# Patient Record
Sex: Female | Born: 1990 | Race: Black or African American | Hispanic: No | Marital: Married | Smoking: Never smoker
Health system: Southern US, Community
[De-identification: ages and names within clinical notes are randomized; demographics above are authoritative.]

## PROBLEM LIST (undated history)

## (undated) ENCOUNTER — Inpatient Hospital Stay (HOSPITAL_COMMUNITY): Payer: Self-pay

## (undated) ENCOUNTER — Ambulatory Visit: Admission: EM | Payer: Medicaid Other | Source: Home / Self Care

## (undated) DIAGNOSIS — E559 Vitamin D deficiency, unspecified: Secondary | ICD-10-CM

## (undated) DIAGNOSIS — J4540 Moderate persistent asthma, uncomplicated: Secondary | ICD-10-CM

## (undated) DIAGNOSIS — R87629 Unspecified abnormal cytological findings in specimens from vagina: Secondary | ICD-10-CM

## (undated) DIAGNOSIS — K8010 Calculus of gallbladder with chronic cholecystitis without obstruction: Secondary | ICD-10-CM

## (undated) DIAGNOSIS — R51 Headache: Secondary | ICD-10-CM

## (undated) DIAGNOSIS — R0600 Dyspnea, unspecified: Secondary | ICD-10-CM

## (undated) DIAGNOSIS — A6920 Lyme disease, unspecified: Secondary | ICD-10-CM

## (undated) DIAGNOSIS — I10 Essential (primary) hypertension: Secondary | ICD-10-CM

## (undated) DIAGNOSIS — D649 Anemia, unspecified: Secondary | ICD-10-CM

## (undated) DIAGNOSIS — M797 Fibromyalgia: Secondary | ICD-10-CM

## (undated) DIAGNOSIS — I011 Acute rheumatic endocarditis: Secondary | ICD-10-CM

## (undated) DIAGNOSIS — D509 Iron deficiency anemia, unspecified: Secondary | ICD-10-CM

## (undated) DIAGNOSIS — J45909 Unspecified asthma, uncomplicated: Secondary | ICD-10-CM

## (undated) DIAGNOSIS — F99 Mental disorder, not otherwise specified: Secondary | ICD-10-CM

## (undated) DIAGNOSIS — R519 Headache, unspecified: Secondary | ICD-10-CM

## (undated) HISTORY — DX: Mental disorder, not otherwise specified: F99

## (undated) HISTORY — PX: INDUCED ABORTION: SHX677

## (undated) HISTORY — DX: Anemia, unspecified: D64.9

## (undated) HISTORY — DX: Essential (primary) hypertension: I10

## (undated) HISTORY — DX: Unspecified abnormal cytological findings in specimens from vagina: R87.629

## (undated) HISTORY — PX: TONSILLECTOMY: SUR1361

## (undated) HISTORY — DX: Headache, unspecified: R51.9

## (undated) HISTORY — DX: Unspecified asthma, uncomplicated: J45.909

## (undated) HISTORY — DX: Dyspnea, unspecified: R06.00

## (undated) HISTORY — PX: WISDOM TOOTH EXTRACTION: SHX21

## (undated) HISTORY — DX: Headache: R51

---

## 2010-03-09 HISTORY — PX: TONSILLECTOMY: SUR1361

## 2011-12-10 DIAGNOSIS — A6920 Lyme disease, unspecified: Secondary | ICD-10-CM

## 2011-12-10 HISTORY — DX: Lyme disease, unspecified: A69.20

## 2012-12-09 DIAGNOSIS — I011 Acute rheumatic endocarditis: Secondary | ICD-10-CM | POA: Insufficient documentation

## 2012-12-09 HISTORY — DX: Acute rheumatic endocarditis: I01.1

## 2015-05-05 ENCOUNTER — Ambulatory Visit
Admission: EM | Admit: 2015-05-05 | Discharge: 2015-05-05 | Disposition: A | Discharge status: Home / Self Care | Payer: Medicaid Other | Attending: Family Medicine | Admitting: Family Medicine

## 2015-05-05 DIAGNOSIS — M6248 Contracture of muscle, other site: Secondary | ICD-10-CM | POA: Diagnosis not present

## 2015-05-05 DIAGNOSIS — M791 Myalgia, unspecified site: Secondary | ICD-10-CM

## 2015-05-05 DIAGNOSIS — Z8619 Personal history of other infectious and parasitic diseases: Secondary | ICD-10-CM

## 2015-05-05 DIAGNOSIS — M62838 Other muscle spasm: Secondary | ICD-10-CM

## 2015-05-05 HISTORY — DX: Acute rheumatic endocarditis: I01.1

## 2015-05-05 HISTORY — DX: Lyme disease, unspecified: A69.20
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
## 2015-05-05 NOTE — ED Notes (Signed)
Pt states "I have not followed up with a primary care doctor regarding my RA. I have been smoking marijuana to help with my pain."

## 2015-05-05 NOTE — ED Notes (Signed)
Pt states "I have RA with pain everyday. I am now having pain in my left neck that goes down my left back. I do not have a primary doctor because I did not know I had Medicaid until recently."

## 2015-05-05 NOTE — Discharge Instructions (Signed)
Patient will need to contact [facility] for PCP office. The PCP will either need to get lab work or make a decision to refer to rheumatologist or infectious disease person at this time for treatment of arthralgia and evaluate her history of Lyme's disease.      Muscle Cramps and Spasms Muscle cramps and spasms occur when a muscle or muscles tighten and you have no control over this tightening (involuntary muscle contraction). They are a common problem and can develop in any muscle. The most common place is in the calf muscles of the leg. Both muscle cramps and muscle spasms are involuntary muscle contractions, but they also have differences:   Muscle cramps are sporadic and painful. They may last a few seconds to a quarter of an hour. Muscle cramps are often more forceful and last longer than muscle spasms.  Muscle spasms may or may not be painful. They may also last just a few seconds or much longer. CAUSES  It is uncommon for cramps or spasms to be due to a serious underlying problem. In many cases, the cause of cramps or spasms is unknown. Some common causes are:   Overexertion.   Overuse from repetitive motions (doing the same thing over and over).   Remaining in a certain position for a long period of time.   Improper preparation, form, or technique while performing a sport or activity.   Dehydration.   Injury.   Side effects of some medicines.   Abnormally low levels of the salts and ions in your blood (electrolytes), especially potassium and calcium. This could happen if you are taking water pills (diuretics) or you are pregnant.  Some underlying medical problems can make it more likely to develop cramps or spasms. These include, but are not limited to:   Diabetes.   Parkinson disease.   Hormone disorders, such as thyroid problems.   Alcohol abuse.   Diseases specific to muscles, joints, and bones.   Blood vessel disease where not enough  blood is getting to the muscles.  HOME CARE INSTRUCTIONS   Stay well hydrated. Drink enough water and fluids to keep your urine clear or pale yellow.  It may be helpful to massage, stretch, and relax the affected muscle.  For tight or tense muscles, use a warm towel, heating pad, or hot shower water directed to the affected area.  If you are sore or have pain after a cramp or spasm, applying ice to the affected area may relieve discomfort.  Put ice in a plastic bag.  Place a towel between your skin and the bag.  Leave the ice on for 15-20 minutes, 03-04 times a day.  Medicines used to treat a known cause of cramps or spasms may help reduce their frequency or severity. Only take over-the-counter or prescription medicines as directed by your caregiver. SEEK MEDICAL CARE IF:  Your cramps or spasms get more severe, more frequent, or do not improve over time.  MAKE SURE YOU:   Understand these instructions.  Will watch your condition.  Will get help right away if you are not doing well or get worse. Document Released: 05/17/2002 Document Revised: 03/22/2013 Document Reviewed: 11/11/2012 [facility] Patient Information 2015 [place], [place]. This information is not intended to replace advice given to you by your health care provider. Make sure you discuss any questions you have with your health care provider. Arthritis, Nonspecific Arthritis is pain, redness, warmth, or puffiness (inflammation) of a joint. The joint may be  stiff or hurt when you move it. One or more joints may be affected. There are many types of arthritis. Your doctor may not know what type you have right away. The most common cause of arthritis is wear and tear on the joint (osteoarthritis). HOME CARE   Only take medicine as told by your doctor.  Rest the joint as much as possible.  Raise (elevate) your joint if it is puffy.  Use crutches if the painful joint is in your leg.  Drink enough fluids to keep your pee  (urine) clear or pale yellow.  Follow your doctor's diet instructions.  Use cold packs for very bad joint pain for 10 to 15 minutes every hour. Ask your doctor if it is okay for you to use hot packs.  Exercise as told by your doctor.  Take a warm shower if you have stiffness in the morning.  Move your sore joints throughout the day. GET HELP RIGHT AWAY IF:   You have a fever.  You have very bad joint pain, puffiness, or redness.  You have many joints that are painful and puffy.  You are not getting better with treatment.  You have very bad back pain or leg weakness.  You cannot control when you poop (bowel movement) or pee (urinate).  You do not feel better in 24 hours or are getting worse.  You are having side effects from your medicine. MAKE SURE YOU:   Understand these instructions.  Will watch your condition.  Will get help right away if you are not doing well or get worse. Document Released: 02/19/2010 Document Revised: 05/26/2012 Document Reviewed: 02/19/2010 [facility] Patient Information 2015 [place]. This information is not intended to replace advice given to you by your health care provider. Make sure you discuss any questions you have with your health care provider. Lyme Disease You may have been bitten by a tick and are to watch for the development of Lyme Disease. Lyme Disease is an infection that is caused by a bacteria The bacteria causing this disease is named Borreilia burgdorferi. If a tick is infected with this bacteria and then bites you, then Lyme Disease may occur. These ticks are carried by deer and rodents such as rabbits and mice and infest grassy as well as forested areas. Fortunately most tick bites do not cause Lyme Disease.  Lyme Disease is easier to prevent than to treat. First, covering your legs with clothing when walking in areas where ticks are possibly abundant will prevent their attachment because ticks tend to stay within inches of the  ground. Second, using insecticides containing DEET can be applied on skin or clothing. Last, because it takes about 12 to 24 hours for the tick to transmit the disease after attachment to the human host, you should inspect your body for ticks twice a day when you are in areas where Lyme Disease is common. You must look thoroughly when searching for ticks. The Ixodes tick that carries Lyme Disease is very small. It is around the size of a sesame seed (picture of tick is not actual size). Removal is best done by grasping the tick by the head and pulling it out. Do not to squeeze the body of the tick. This could inject the infecting bacteria into the bite site. Wash the area of the bite with an antiseptic solution after removal.  Lyme Disease is a disease that may affect many body systems. Because of the small size of the biting tick, most people  do not notice being bitten. The first sign of an infection is usually a round red rash that extends out from the center of the tick bite. The center of the lesion may be blood colored (hemorrhagic) or have tiny blisters (vesicular). Most lesions have bright red outer borders and partial central clearing. This rash may extend out many inches in diameter, and multiple lesions may be present. Other symptoms such as fatigue, headaches, chills and fever, general achiness and swelling of lymph glands may also occur. If this first stage of the disease is left untreated, these symptoms may gradually resolve by themselves, or progressive symptoms may occur because of spread of infection to other areas of the body.  Follow up with your caregiver to have testing and treatment if you have a tick bite and you develop any of the above complaints. Your caregiver may recommend preventative (prophylactic) medications which kill bacteria (antibiotics). Once a diagnosis of Lyme Disease is made, antibiotic treatment is highly likely to cure the disease. Effective treatment of late stage Lyme  Disease may require longer courses of antibiotic therapy.  MAKE SURE YOU:   Understand these instructions.  Will watch your condition.  Will get help right away if you are not doing well or get worse. Document Released: 03/03/2001 Document Revised: 02/17/2012 Document Reviewed: 05/05/2009 [facility] Patient Information 2015 [place], [place]. This information is not intended to replace advice given to you by your health care provider. Make sure you discuss any questions you have with your health care provider.

## 2015-08-03 DIAGNOSIS — D563 Thalassemia minor: Secondary | ICD-10-CM | POA: Insufficient documentation

## 2015-08-07 ENCOUNTER — Other Ambulatory Visit: Payer: Self-pay | Admitting: General Practice

## 2015-08-07 ENCOUNTER — Ambulatory Visit
Admission: RE | Admit: 2015-08-07 | Discharge: 2015-08-07 | Disposition: A | Discharge status: Home / Self Care | Payer: Disability Insurance | Source: Ambulatory Visit | Attending: General Practice | Admitting: General Practice

## 2015-08-07 DIAGNOSIS — M1990 Unspecified osteoarthritis, unspecified site: Secondary | ICD-10-CM

## 2015-08-07 DIAGNOSIS — Z8619 Personal history of other infectious and parasitic diseases: Secondary | ICD-10-CM | POA: Diagnosis not present

## 2016-10-20 ENCOUNTER — Emergency Department (HOSPITAL_COMMUNITY): Payer: Medicaid Other

## 2016-10-20 ENCOUNTER — Emergency Department (HOSPITAL_COMMUNITY)
Admission: EM | Admit: 2016-10-20 | Discharge: 2016-10-20 | Disposition: A | Discharge status: Home / Self Care | Payer: Medicaid Other | Attending: Emergency Medicine | Admitting: Emergency Medicine

## 2016-10-20 ENCOUNTER — Encounter (HOSPITAL_COMMUNITY): Payer: Self-pay | Admitting: *Deleted

## 2016-10-20 DIAGNOSIS — M79675 Pain in left toe(s): Secondary | ICD-10-CM

## 2016-10-20 DIAGNOSIS — L089 Local infection of the skin and subcutaneous tissue, unspecified: Secondary | ICD-10-CM | POA: Diagnosis not present

## 2016-10-20 MED ORDER — CEPHALEXIN 500 MG PO CAPS: 500.0000 mg | ORAL_CAPSULE | Freq: Four times a day (QID) | ORAL | 0 refills | Status: DC

## 2016-10-20 MED ORDER — IBUPROFEN 800 MG PO TABS
800.0000 mg | ORAL_TABLET | Freq: Once | ORAL | Status: AC
  Administered 2016-10-20: 800 mg via ORAL
  Filled 2016-10-20: qty 1

## 2016-10-20 MED ORDER — IBUPROFEN 800 MG PO TABS: 800.0000 mg | ORAL_TABLET | Freq: Three times a day (TID) | ORAL | 0 refills | Status: DC

## 2016-10-20 NOTE — ED Provider Notes (Signed)
[facility]EMER[facility]ENC[facility] DEPT Provider Note   CSN: [id number] Arrival date & time: 10/20/16  0032     History   Chief Complaint Chief Complaint  Patient presents with  . Toe Pain    HPI Caitlin Hartman is a 25 y.o. female with a hx of Lyme disease, rheumatoid arthritis, fibromyalgia presents to the Emergency Department complaining of gradual, persistent, progressively worsening left great toe pain onset 2 hours prior to arrival. Patient denies trauma. She reports the last time she trimmed her nails was 2 weeks ago however nails appear to be trimmed all the way to the nailbed. Patient denies fevers or chills, nausea or vomiting. She reports associated swelling in the left great toe. Patient denies going to a nail salon. No treatments attempted for her to arrival.  Nothing makes it better and palpation makes it worse.  She reports she is able to ambulate with some pain but without difficulty.     The history is provided by the patient and medical records. No language interpreter was used.    Past Medical History:  Diagnosis Date  . Lyme disease 2013  . Rheumatoid aortitis 2014   pt states dx'd by ED doctor, has not followed up     There are no active problems to display for this patient.   History reviewed. No pertinent surgical history.  OB History    No data available       Home Medications    Prior to Admission medications   Medication Sig Start Date End Date Taking? Authorizing Provider  cyclobenzaprine (FLEXERIL) 5 MG tablet Take 5 mg by mouth 3 (three) times daily as needed for muscle spasms.   Yes Historical Provider, MD  cephALEXin (KEFLEX) 500 MG capsule Take 1 capsule (500 mg total) by mouth 4 (four) times daily. 10/20/16   Breniyah Romm, PA-C  ibuprofen (ADVIL,MOTRIN) 800 MG tablet Take 1 tablet (800 mg total) by mouth 3 (three) times daily. 10/20/16   Dahlia ClientHannah Jaunita Mikels, PA-C    Family History No family history on file.  Social History Social History    Substance Use Topics  . Smoking status: Never Smoker  . Smokeless tobacco: Never Used  . Alcohol use No     Allergies   Latex   Review of Systems Review of Systems  Musculoskeletal: Positive for arthralgias ( left toe).  Skin: Positive for color change.  All other systems reviewed and are negative.    Physical Exam Updated Vital Signs BP 123/89 (BP Location: Left Arm)   Pulse 82   Temp 98.5 F (36.9 C) (Oral)   Resp 18   Ht 5\' 8"  (1.727 m)   Wt 80.8 kg   LMP 09/23/2016   SpO2 100%   BMI 27.09 kg/m   Physical Exam  Constitutional: She appears well-developed and well-nourished. No distress.  HENT:  Head: Normocephalic and atraumatic.  Eyes: Conjunctivae are normal.  Neck: Normal range of motion.  Cardiovascular: Normal rate, regular rhythm and intact distal pulses.   Capillary refill < 3 sec  Pulmonary/Chest: Effort normal and breath sounds normal.  Musculoskeletal: She exhibits tenderness. She exhibits no edema.       Feet:  Left great toe: Swelling, tenderness and erythema to the distal portion of the left toe. No induration of the skin. No purulence or swelling around the nail fold or cuticle. Nail is trimmed very short down to the naibed. Pad of the toe is soft but very tender.  Significant tenderness of the distal or proximal joint.  Full range of motion of the MTP.    Neurological: She is alert. Coordination normal.  Sensation intact to normal touch in the bilateral lower extremity  Strength 5/5 in the right great toe, 4/5 in the left great toe due to pain  Skin: Skin is warm and dry. She is not diaphoretic.  No tenting of the skin  Psychiatric: She has a normal mood and affect.  Nursing note and vitals reviewed.    ED Treatments / Results   Radiology Dg Toe Great Left  Result Date: 10/20/2016 CLINICAL DATA:  Left first toe pain and swelling.  No known injury. EXAM: LEFT GREAT TOE COMPARISON:  None. FINDINGS: There is no evidence of fracture or  dislocation. There is no evidence of arthropathy or other focal bone abnormality. Soft tissues are unremarkable. IMPRESSION: Negative. Electronically Signed   By: Burman NievesWilliam  Stevens M.D.   On: 10/20/2016 02:29    Procedures Procedures (including critical care time)  Medications Ordered in ED Medications  ibuprofen (ADVIL,MOTRIN) tablet 800 mg (800 mg Oral Given 10/20/16 0123)     Initial Impression / Assessment and Plan / ED Course  I have reviewed the triage vital signs and the nursing notes.  Pertinent labs & imaging results that were available during my care of the patient were reviewed by me and considered in my medical decision making (see chart for details).  Clinical Course    Pt With swelling and pain in the left great toe. Erythema suggestive of soft tissue infection likely from trimming of the toenail. No evidence of paronychia or overt abscess.  X-ray without acute abnormality including no gas in the soft tissue.  Discharge home with antibiotics. Patient will follow-up with podiatry for further evaluation and treatment. Discussed reasons to return to emergency department including worsening infection. Patient and significant other state understanding and agreement with the plan.  Final Clinical Impressions(s) / ED Diagnoses   Final diagnoses:  Toe infection  Pain of left great toe    New Prescriptions New Prescriptions   CEPHALEXIN (KEFLEX) 500 MG CAPSULE    Take 1 capsule (500 mg total) by mouth 4 (four) times daily.   IBUPROFEN (ADVIL,MOTRIN) 800 MG TABLET    Take 1 tablet (800 mg total) by mouth 3 (three) times daily.     Dahlia ClientHannah Kaeli Nichelson, PA-C 10/20/16 [id number]    Shon Batonourtney F Horton, MD 10/20/16 [phone]

## 2016-10-20 NOTE — ED Triage Notes (Signed)
The pt is c/o lt great toe pain she woke up with the pain  1900  No knonw injury.  lmp oct 16th

## 2016-10-20 NOTE — Discharge Instructions (Signed)
1. Medications: keflex, ibuprofen for pain, usual home medications 2. Treatment: rest, drink plenty of fluids, warm water soaks 3x per day 3. Follow Up: Please followup with podiatry in 2-3 days for discussion of your diagnoses and further evaluation after today's visit; if you do not have a primary care doctor use the resource guide provided to find one; Please return to the ER for worsening pain, worsening infection, streaking of red, loss of sensation or other concerns

## 2016-12-09 NOTE — L&D Delivery Note (Signed)
Delivery Note Progressed quickly to complete dilation FHR had deep variables with contractions  At 10:06 PM a viable and healthy female was delivered via Vaginal, Spontaneous (Presentation: ;  ).  APGAR: 9, 9; weight  .   Placenta status: spontaneous and grossly intact with 3 vessel Cord:  with the following complications: none  Anesthesia:  none Episiotomy: None Lacerations: None Suture Repair: none Est. Blood Loss (mL):  50  Mom to postpartum.  Baby to Couplet care / Skin to Skin.  Caitlin Hartman 10/25/2017, 10:31 PM  Please schedule this patient for PP visit in: 4 weeks Low risk pregnancy complicated by: ANA positive, Rheumatoid arthritis Delivery mode:  SVD Anticipated Birth Control:  IUD PP Procedures needed: IUD  Schedule Integrated BH visit: no Provider: Any provider

## 2016-12-22 ENCOUNTER — Emergency Department (HOSPITAL_COMMUNITY)
Admission: EM | Admit: 2016-12-22 | Discharge: 2016-12-22 | Disposition: A | Discharge status: Home / Self Care | Payer: Medicaid Other | Attending: Emergency Medicine | Admitting: Emergency Medicine

## 2016-12-22 ENCOUNTER — Encounter (HOSPITAL_COMMUNITY): Payer: Self-pay

## 2016-12-22 DIAGNOSIS — H00025 Hordeolum internum left lower eyelid: Secondary | ICD-10-CM | POA: Diagnosis not present

## 2016-12-22 DIAGNOSIS — H5712 Ocular pain, left eye: Secondary | ICD-10-CM | POA: Diagnosis present

## 2016-12-22 DIAGNOSIS — Z91040 Latex allergy status: Secondary | ICD-10-CM | POA: Insufficient documentation

## 2016-12-22 NOTE — ED Provider Notes (Signed)
[facility] Provider Note   CSN: [id number] Arrival date & time: 12/22/16  0008     History   Chief Complaint Chief Complaint  Patient presents with  . Eye Problem    HPI Caitlin Hartman is a 26 y.o. female.  HPI Patient presents with sudden onset, constant, mild left lower eyelid pain that began this morning. No trauma or eye injury. She denies eye pain, eye pain with movement, purulent or watery discharge, redness, photophobia, headache, or visual disturbances. She has not tried anything for her symptoms. She does not wear contacts.  Past Medical History:  Diagnosis Date  . Lyme disease 2013  . Rheumatoid aortitis 2014   pt states dx'd by ED doctor, has not followed up     There are no active problems to display for this patient.   History reviewed. No pertinent surgical history.  OB History    No data available       Home Medications    Prior to Admission medications   Medication Sig Start Date End Date Taking? Authorizing Provider  cephALEXin (KEFLEX) 500 MG capsule Take 1 capsule (500 mg total) by mouth 4 (four) times daily. 10/20/16   Hannah Muthersbaugh, PA-C  cyclobenzaprine (FLEXERIL) 5 MG tablet Take 5 mg by mouth 3 (three) times daily as needed for muscle spasms.    Historical Provider, MD  ibuprofen (ADVIL,MOTRIN) 800 MG tablet Take 1 tablet (800 mg total) by mouth 3 (three) times daily. 10/20/16   Dahlia Client Muthersbaugh, PA-C    Family History No family history on file.  Social History Social History  Substance Use Topics  . Smoking status: Never Smoker  . Smokeless tobacco: Never Used  . Alcohol use No     Allergies   Latex   Review of Systems Review of Systems All other systems negative unless otherwise stated in HPI   Physical Exam Updated Vital Signs BP 148/96 (BP Location: Left Arm)   Pulse 65   Temp 98.5 F (36.9 C) (Oral)   Resp 18   LMP 11/22/2016   SpO2 100%   Physical Exam  Constitutional: She is oriented to  person, place, and time. She appears well-developed and well-nourished.  HENT:  Head: Normocephalic and atraumatic.  Right Ear: External ear normal.  Left Ear: External ear normal.  Eyes: Conjunctivae, EOM and lids are normal. Pupils are equal, round, and reactive to light. Lids are everted and swept, no foreign bodies found. Right eye exhibits no chemosis, no discharge, no exudate and no hordeolum. No foreign body present in the right eye. Left eye exhibits hordeolum. Left eye exhibits no chemosis, no discharge and no exudate. No foreign body present in the left eye. Right conjunctiva is not injected. Left conjunctiva is not injected. No scleral icterus.  Neck: No tracheal deviation present.  Pulmonary/Chest: Effort normal. No respiratory distress.  Abdominal: She exhibits no distension.  Musculoskeletal: Normal range of motion.  Neurological: She is alert and oriented to person, place, and time.  Skin: Skin is warm and dry.  Psychiatric: She has a normal mood and affect. Her behavior is normal.     ED Treatments / Results  Labs (all labs ordered are listed, but only abnormal results are displayed) Labs Reviewed - No data to display  EKG  EKG Interpretation None       Radiology No results found.  Procedures Procedures (including critical care time)  Medications Ordered in ED Medications - No data to display   Initial Impression / Assessment  and Plan / ED Course  I have reviewed the triage vital signs and the nursing notes.  Pertinent labs & imaging results that were available during my care of the patient were reviewed by me and considered in my medical decision making (see chart for details).  Clinical Course    Patient with stye. No signs of periorbital cellulitis. No systemic symptoms. No neurologic symptoms. Vital stable. Patient appears well, nontoxic or ill. Discussed symptomatic treatment with warm compresses. Patient given follow-up with ophthalmology in 1-2  weeks if symptoms persist. Return precautions discussed. Stable for discharge.  Final Clinical Impressions(s) / ED Diagnoses   Final diagnoses:  Hordeolum internum of left lower eyelid    New Prescriptions New Prescriptions   No medications on file     Cheri FowlerKayla Dominic Rhome, PA-C 12/22/16 0112    Melene Planan Floyd, DO 12/22/16 [phone]

## 2016-12-22 NOTE — ED Notes (Signed)
See PA's notes  

## 2016-12-22 NOTE — ED Triage Notes (Signed)
Pt states that she woke up today with L eye hurting, bottom of L eye swollen, no redness noted.

## 2016-12-22 NOTE — Discharge Instructions (Signed)
You have a stye. Apply warm compresses 3-4 times throughout the day. Avoid eye makeup. Follow up with ophthalmology if no improvement in 1-2 weeks. Return to the emergency department for any new or concerning symptoms.

## 2017-01-31 DIAGNOSIS — J4520 Mild intermittent asthma, uncomplicated: Secondary | ICD-10-CM | POA: Insufficient documentation

## 2017-01-31 DIAGNOSIS — J4540 Moderate persistent asthma, uncomplicated: Secondary | ICD-10-CM | POA: Insufficient documentation

## 2017-02-26 ENCOUNTER — Ambulatory Visit (INDEPENDENT_AMBULATORY_CARE_PROVIDER_SITE_OTHER): Payer: Medicaid Other | Admitting: *Deleted

## 2017-02-26 DIAGNOSIS — Z3201 Encounter for pregnancy test, result positive: Secondary | ICD-10-CM

## 2017-02-26 DIAGNOSIS — Z3200 Encounter for pregnancy test, result unknown: Secondary | ICD-10-CM

## 2017-02-26 LAB — POCT URINE PREGNANCY: Preg Test, Ur: POSITIVE — AB

## 2017-02-26 MED ORDER — PRENATE PIXIE 10-0.6-0.4-200 MG PO CAPS: 1.0000 | ORAL_CAPSULE | Freq: Every day | ORAL | 11 refills | Status: DC

## 2017-02-26 NOTE — Progress Notes (Signed)
Pt is in office for UPT. Test in office today is Positive. Pt states a LMP of 01/23/17. [facility] for emergent care. Pt was advised that she may make NOB appt at check out today if she chooses. PNV was sent to pt pharmacy at today's visit.  Pt states that she is currently be followed for Fibromyalgia management. Pt states she sees [facility] for this problem.  Pt states she was told not to take her current medications during pregnancy.  Pt states she currently has Tramadol, Baclofen and Gabapentin. Medications were reviewed with Dr Alysia PennaErvin today.  He advised for pt to not take medication during pregnancy.  Advised that her current provider should be able to manage her medications during her pregnancy. Pt was advised to contact her office in order to determine her plan for medication management during pregnancy. Pt advised if any problems, she may call our office if needed.  Pt states understanding and has no other concerns today.

## 2017-03-18 ENCOUNTER — Emergency Department (HOSPITAL_COMMUNITY)
Admission: EM | Admit: 2017-03-18 | Discharge: 2017-03-18 | Disposition: A | Discharge status: Home / Self Care | Payer: Medicaid Other | Attending: Emergency Medicine | Admitting: Emergency Medicine

## 2017-03-18 ENCOUNTER — Encounter (HOSPITAL_COMMUNITY): Payer: Self-pay

## 2017-03-18 DIAGNOSIS — O210 Mild hyperemesis gravidarum: Secondary | ICD-10-CM

## 2017-03-18 DIAGNOSIS — Z91040 Latex allergy status: Secondary | ICD-10-CM | POA: Diagnosis not present

## 2017-03-18 DIAGNOSIS — Z3A01 Less than 8 weeks gestation of pregnancy: Secondary | ICD-10-CM | POA: Insufficient documentation

## 2017-03-18 HISTORY — DX: Fibromyalgia: M79.7

## 2017-03-18 LAB — COMPREHENSIVE METABOLIC PANEL
ALBUMIN: 4.2 g/dL (ref 3.5–5.0)
ALT: 19 U/L (ref 14–54)
AST: 21 U/L (ref 15–41)
Alkaline Phosphatase: 34 U/L — ABNORMAL LOW (ref 38–126)
Anion gap: 7 (ref 5–15)
BUN: 8 mg/dL (ref 6–20)
CHLORIDE: 104 mmol/L (ref 101–111)
CO2: 24 mmol/L (ref 22–32)
Calcium: 9.1 mg/dL (ref 8.9–10.3)
Creatinine, Ser: 0.8 mg/dL (ref 0.44–1.00)
GFR calc Af Amer: >60 mL/min (ref >60)
GFR calc non Af Amer: >60 mL/min (ref >60)
GLUCOSE: 102 mg/dL — AB (ref 65–99)
POTASSIUM: 3.9 mmol/L (ref 3.5–5.1)
SODIUM: 135 mmol/L (ref 135–145)
Total Bilirubin: 0.8 mg/dL (ref 0.3–1.2)
Total Protein: 7.2 g/dL (ref 6.5–8.1)

## 2017-03-18 LAB — I-STAT BETA HCG BLOOD, ED (MC, WL, AP ONLY): I-stat hCG, quantitative: >2000 m[IU]/mL — ABNORMAL HIGH (ref <5)

## 2017-03-18 LAB — URINALYSIS, ROUTINE W REFLEX MICROSCOPIC
BACTERIA UA: NONE SEEN
Bilirubin Urine: NEGATIVE
Glucose, UA: NEGATIVE mg/dL
HGB URINE DIPSTICK: NEGATIVE
Ketones, ur: 20 mg/dL — AB
LEUKOCYTES UA: NEGATIVE
Nitrite: NEGATIVE
PROTEIN: 30 mg/dL — AB
Specific Gravity, Urine: 1.026 (ref 1.005–1.030)
pH: 5 (ref 5.0–8.0)

## 2017-03-18 LAB — CBC
HEMATOCRIT: 37.3 % (ref 36.0–46.0)
Hemoglobin: 12.5 g/dL (ref 12.0–15.0)
MCH: 24.1 pg — ABNORMAL LOW (ref 26.0–34.0)
MCHC: 33.5 g/dL (ref 30.0–36.0)
MCV: 71.9 fL — AB (ref 78.0–100.0)
Platelets: 217 10*3/uL (ref 150–400)
RBC: 5.19 MIL/uL — ABNORMAL HIGH (ref 3.87–5.11)
RDW: 14.5 % (ref 11.5–15.5)
WBC: 5.5 10*3/uL (ref 4.0–10.5)

## 2017-03-18 LAB — LIPASE, BLOOD

## 2017-03-18 MED ORDER — DOXYLAMINE-PYRIDOXINE 10-10 MG PO TBEC: 10.0000 mg | DELAYED_RELEASE_TABLET | Freq: Every day | ORAL | 0 refills | Status: DC

## 2017-03-18 NOTE — Discharge Instructions (Signed)
Please read and follow all provided instructions.  Your diagnoses today include:  1. Hyperemesis gravidarum     Tests performed today include: Vital signs. See below for your results today.   Medications prescribed:  Take as prescribed   Home care instructions:  Follow any educational materials contained in this packet.  Follow-up instructions: Please follow-up with your primary care provider for further evaluation of symptoms and treatment   Return instructions:  Please return to the Emergency Department if you do not get better, if you get worse, or new symptoms OR  - Fever (temperature greater than 101.90F)  - Bleeding that does not stop with holding pressure to the area    -Severe pain (please note that you may be more sore the day after your accident)  - Chest Pain  - Difficulty breathing  - Severe nausea or vomiting  - Inability to tolerate food and liquids  - Passing out  - Skin becoming red around your wounds  - Change in mental status (confusion or lethargy)  - New numbness or weakness    Please return if you have any other emergent concerns.  Additional Information:  Your vital signs today were: BP 129/84 (BP Location: Left Arm)    Pulse 99    Temp 97.9 F (36.6 C) (Oral)    Resp 18    Ht  (1.727 m)    Wt 81.6 kg    LMP 01/23/2017    SpO2 100%    BMI 27.37 kg/m  If your blood pressure (BP) was elevated above 135/85 this visit, please have this repeated by your doctor within one month. ---------------

## 2017-03-18 NOTE — ED Provider Notes (Signed)
[facility]EM[facility]Y DEPT Provider Note   CSN: [id number] Arrival date & time: 03/18/17  0756     History   Chief Complaint Chief Complaint  Patient presents with  . Emesis During Pregnancy    HPI Caitlin Hartman is a 26 y.o. female.  HPI  26 y.o. female [phone] currently  [redacted] weeks pregnant presents to the Emergency Department today complaining of emesis and diarrhea x 1 week. Attempted phenergan with minimal relief. She reports constant nausea,vomiting, crampy abdominal pain, fatigue and diarrhea. Patients pre-pregnancy weight was self reported at 177lbs. She denies vaginal bleeding, dysuria, headache, peripheral edema. She reports that she has not been able to keep down food or liquids. She has tried ginger tea with no relief. She has not had any prenatal at this point. She denies any history of multiple gestations or molar pregnancy. She reports that she had nausea and vomiting in previous pregnancies but this is more severe.   Past Medical History:  Diagnosis Date  . Fibromyalgia   . Lyme disease 2013  . Rheumatoid aortitis 2014   pt states dx'd by ED doctor, has not followed up     There are no active problems to display for this patient.   Past Surgical History:  Procedure Laterality Date  . INDUCED ABORTION     x 3   . TONSILLECTOMY      OB History    Gravida Para Term Preterm AB Living   1             SAB TAB Ectopic Multiple Live Births                   Home Medications    Prior to Admission medications   Medication Sig Start Date End Date Taking? Authorizing Provider  cephALEXin (KEFLEX) 500 MG capsule Take 1 capsule (500 mg total) by mouth 4 (four) times daily. Patient not taking: Reported on 02/26/2017 10/20/16   Dahlia Client Muthersbaugh, PA-C  cyclobenzaprine (FLEXERIL) 5 MG tablet Take 5 mg by mouth 3 (three) times daily as needed for muscle spasms.    Historical Provider, MD  Prenat-FeAsp-Meth-FA-DHA w/o A (PRENATE PIXIE) 10-0.6-0.4-200 MG CAPS Take 1 capsule  by mouth daily. 02/26/17   Hermina Staggers, MD    Family History No family history on file.  Social History Social History  Substance Use Topics  . Smoking status: Never Smoker  . Smokeless tobacco: Never Used  . Alcohol use No     Allergies   Latex   Review of Systems Review of Systems  Constitutional: Positive for fatigue.  Cardiovascular: Negative for leg swelling.  Gastrointestinal: Positive for abdominal pain (cramping), diarrhea, nausea and vomiting.  Genitourinary: Negative for dysuria and vaginal bleeding.  Neurological: Negative for headaches.   Physical Exam Updated Vital Signs BP 129/84 (BP Location: Left Arm)   Pulse 99   Temp 97.9 F (36.6 C) (Oral)   Resp 18   Ht  (1.727 m)   Wt 81.6 kg   LMP 01/23/2017   SpO2 100%   BMI 27.37 kg/m   Physical Exam  Constitutional: She is oriented to person, place, and time. Vital signs are normal. She appears well-developed and well-nourished.  HENT:  Head: Normocephalic and atraumatic.  Right Ear: Hearing normal.  Left Ear: Hearing normal.  Eyes: Conjunctivae and EOM are normal. Pupils are equal, round, and reactive to light.  Neck: Normal range of motion. Neck supple.  Cardiovascular: Normal rate, regular rhythm, normal heart sounds and intact  distal pulses.   Pulmonary/Chest: Effort normal and breath sounds normal. No respiratory distress. She has no wheezes. She has no rales. She exhibits no tenderness.  Abdominal: Soft. There is no tenderness.  Musculoskeletal: Normal range of motion.  Neurological: She is alert and oriented to person, place, and time.  Skin: Skin is warm and dry.  Psychiatric: She has a normal mood and affect. Her speech is normal and behavior is normal. Thought content normal.  Nursing note and vitals reviewed.  ED Treatments / Results  Labs (all labs ordered are listed, but only abnormal results are displayed) Labs Reviewed  LIPASE, BLOOD - Abnormal; Notable for the following:         Result Value   Lipase <10 (*)    All other components within normal limits  COMPREHENSIVE METABOLIC PANEL - Abnormal; Notable for the following:    Glucose, Bld 102 (*)    Alkaline Phosphatase 34 (*)    All other components within normal limits  CBC - Abnormal; Notable for the following:    RBC 5.19 (*)    MCV 71.9 (*)    MCH 24.1 (*)    All other components within normal limits  URINALYSIS, ROUTINE W REFLEX MICROSCOPIC - Abnormal; Notable for the following:    Color, Urine AMBER (*)    Ketones, ur 20 (*)    Protein, ur 30 (*)    Squamous Epithelial / LPF 0-5 (*)    All other components within normal limits  I-STAT BETA HCG BLOOD, ED ([facility], [facility], [facility] ONLY) - Abnormal; Notable for the following:    I-stat hCG, quantitative >2,000.0 (*)    All other components within normal limits    EKG  EKG Interpretation None       Radiology No results found.  Procedures Procedures (including critical care time)  Medications Ordered in ED Medications - No data to display   Initial Impression / Assessment and Plan / ED Course  I have reviewed the triage vital signs and the nursing notes.  Pertinent labs & imaging results that were available during my care of the patient were reviewed by me and considered in my medical decision making (see chart for details).  Final Clinical Impressions(s) / ED Diagnoses  {I have reviewed and evaluated the relevant laboratory values.   {I have reviewed the relevant previous healthcare records.  {I obtained HPI from historian.   ED Course:  Assessment: Pt is a 26 y.o. female [phone] currently  [redacted] weeks pregnant presents to the Emergency Department today complaining of emesis and diarrhea x 1 week. On exam, pt in NAD. Nontoxic/nonseptic appearing. VSS. Afebrile. Lungs CTA. Heart RRR. Abdomen nontender soft. Denies abdominal pain. No vaginal bleeding. Labs unremarkable. Given alcohol swab that prevented nausea and able to tolerate PO in ED withotu  difficulty. Will DC home with Diclegis and follow up to PCP. Pt comfortable with plan. Strict return precautions given. Plan is to DC home. At time of discharge, Patient is in no acute distress. Vital Signs are stable. Patient is able to ambulate. Patient able to tolerate PO.   Disposition/Plan:  DC Home Additional Verbal discharge instructions given and discussed with patient.  Pt Instructed to f/u with PCP in the next week for evaluation and treatment of symptoms. Return precautions given Pt acknowledges and agrees with plan  Supervising Physician Cathren Laine, MD  Final diagnoses:  Hyperemesis gravidarum    New Prescriptions New Prescriptions   No medications on file  Audry Pili, PA-C 03/18/17 1002    Cathren Laine, MD 03/18/17 1030

## 2017-03-18 NOTE — ED Triage Notes (Signed)
Per Pt, Pt is coming from home with complaints of emesis and diarrhea x 1 week while pregnant. Pt was prescribed phenergan and it has not been successful. UNable to keep anything down for one week.

## 2017-03-26 ENCOUNTER — Encounter: Payer: Medicaid Other | Admitting: Certified Nurse Midwife

## 2017-03-26 ENCOUNTER — Emergency Department (HOSPITAL_COMMUNITY)
Admission: EM | Admit: 2017-03-26 | Discharge: 2017-03-26 | Disposition: A | Discharge status: Home / Self Care | Payer: Medicaid Other | Attending: Emergency Medicine | Admitting: Emergency Medicine

## 2017-03-26 ENCOUNTER — Encounter (HOSPITAL_COMMUNITY): Payer: Self-pay

## 2017-03-26 DIAGNOSIS — Z3A09 9 weeks gestation of pregnancy: Secondary | ICD-10-CM | POA: Diagnosis not present

## 2017-03-26 DIAGNOSIS — O219 Vomiting of pregnancy, unspecified: Secondary | ICD-10-CM

## 2017-03-26 DIAGNOSIS — Z79899 Other long term (current) drug therapy: Secondary | ICD-10-CM | POA: Insufficient documentation

## 2017-03-26 DIAGNOSIS — Z91040 Latex allergy status: Secondary | ICD-10-CM | POA: Diagnosis not present

## 2017-03-26 DIAGNOSIS — O210 Mild hyperemesis gravidarum: Secondary | ICD-10-CM | POA: Insufficient documentation

## 2017-03-26 LAB — COMPREHENSIVE METABOLIC PANEL
ALT: 19 U/L (ref 14–54)
ANION GAP: 9 (ref 5–15)
AST: 18 U/L (ref 15–41)
Albumin: 3.9 g/dL (ref 3.5–5.0)
Alkaline Phosphatase: 29 U/L — ABNORMAL LOW (ref 38–126)
BUN: 7 mg/dL (ref 6–20)
CO2: 22 mmol/L (ref 22–32)
CREATININE: 0.66 mg/dL (ref 0.44–1.00)
Calcium: 9.3 mg/dL (ref 8.9–10.3)
Chloride: 104 mmol/L (ref 101–111)
GFR calc Af Amer: >60 mL/min (ref >60)
GFR calc non Af Amer: >60 mL/min (ref >60)
Glucose, Bld: 77 mg/dL (ref 65–99)
Potassium: 3.6 mmol/L (ref 3.5–5.1)
SODIUM: 135 mmol/L (ref 135–145)
Total Bilirubin: 0.7 mg/dL (ref 0.3–1.2)
Total Protein: 6.7 g/dL (ref 6.5–8.1)

## 2017-03-26 LAB — CBC WITH DIFFERENTIAL/PLATELET
BASOS ABS: 0 10*3/uL (ref 0.0–0.1)
Basophils Relative: 0 %
Eosinophils Absolute: 0.1 10*3/uL (ref 0.0–0.7)
Eosinophils Relative: 1 %
HEMATOCRIT: 34.8 % — AB (ref 36.0–46.0)
HEMOGLOBIN: 11.7 g/dL — AB (ref 12.0–15.0)
LYMPHS PCT: 27 %
Lymphs Abs: 1.4 10*3/uL (ref 0.7–4.0)
MCH: 24 pg — ABNORMAL LOW (ref 26.0–34.0)
MCHC: 33.6 g/dL (ref 30.0–36.0)
MCV: 71.3 fL — ABNORMAL LOW (ref 78.0–100.0)
MONOS PCT: 7 %
Monocytes Absolute: 0.4 10*3/uL (ref 0.1–1.0)
NEUTROS ABS: 3.3 10*3/uL (ref 1.7–7.7)
NEUTROS PCT: 65 %
Platelets: 211 10*3/uL (ref 150–400)
RBC: 4.88 MIL/uL (ref 3.87–5.11)
RDW: 14.2 % (ref 11.5–15.5)
WBC: 5.2 10*3/uL (ref 4.0–10.5)

## 2017-03-26 LAB — URINALYSIS, ROUTINE W REFLEX MICROSCOPIC
Bilirubin Urine: NEGATIVE
Glucose, UA: NEGATIVE mg/dL
Hgb urine dipstick: NEGATIVE
Ketones, ur: 80 mg/dL — AB
LEUKOCYTES UA: NEGATIVE
NITRITE: NEGATIVE
Protein, ur: NEGATIVE mg/dL
SPECIFIC GRAVITY, URINE: 1.028 (ref 1.005–1.030)
pH: 6 (ref 5.0–8.0)

## 2017-03-26 LAB — I-STAT CG4 LACTIC ACID, ED: LACTIC ACID, VENOUS: 0.87 mmol/L (ref 0.5–1.9)

## 2017-03-26 LAB — LIPASE, BLOOD: Lipase: 13 U/L (ref 11–51)

## 2017-03-26 MED ORDER — SODIUM CHLORIDE 0.9 % IV BOLUS (SEPSIS)
1000.0000 mL | Freq: Once | INTRAVENOUS | Status: AC
  Administered 2017-03-26: 1000 mL via INTRAVENOUS

## 2017-03-26 MED ORDER — METOCLOPRAMIDE HCL 5 MG/ML IJ SOLN
10.0000 mg | Freq: Once | INTRAMUSCULAR | Status: AC
  Administered 2017-03-26: 10 mg via INTRAVENOUS
  Filled 2017-03-26: qty 2

## 2017-03-26 NOTE — ED Provider Notes (Signed)
[facility] Provider Note   CSN: [id number] Arrival date & time: 03/26/17  1150  By signing my name below, I, Marnette Burgess Long, attest that this documentation has been prepared under the direction and in the presence of Heide Scales, MD. Electronically Signed: Marnette Burgess Long, Scribe. 03/26/2017. 12:35 PM.  History   Chief Complaint Chief Complaint  Patient presents with  . Emesis During Pregnancy   The history is provided by the patient and medical records. No language interpreter was used.  Emesis   This is a new problem. The current episode started 2 days ago. The problem occurs 5 to 10 times per day. The problem has not changed since onset.The emesis has an appearance of stomach contents. There has been no fever. Pertinent negatives include no abdominal pain, no chills, no cough, no diarrhea, no fever, no headaches, no sweats and no URI.  Possible Pregnancy  Chronicity: pt reports she is 8 weeks 6 days pregnant. Pertinent negatives include no chest pain, no abdominal pain, no headaches and no shortness of breath. She has tried nothing for the symptoms.    HPI Comments:  Caitlin Hartman is a 26 y.o. female G7P4A2 currently 8 weeks, 6 days pregnant with a PMhx of RA, Lyme Disease, and Fibromyalgia, who presents to the Emergency Department complaining of persistent emesis onset two days ago. She reports two days ago the nausea with vomiting began and has persisted since that time. She states for the past week she has had poor to zero PO food intake with little to no fluid intake over the past two days, vomiting everything she has ingested. Per chart review, she was seen here for same on 03/18/17 with improvement after before her symptoms arose again two days ago. She did not fill the Rx nausea medication she received at that time d/t insurance issues. She has had 6-7 episodes of emesis since 0300 this morning. Pt has an additional associated symptom of bladder incontinence during  these episodes of emesis. She tried some B-vitamins at home with no relief of her symptoms. No sick contact with similar symptoms. No alleviating factors noted. Pt denies dysuria. hematuria, constipation, diarrhea, abdominal pain, vaginal bleeding, fever, chills, CP, cough, SOB, flank pain, back pain, and any other complaints at this time.   Past Medical History:  Diagnosis Date  . Fibromyalgia   . Lyme disease 2013  . Rheumatoid aortitis 2014   pt states dx'd by ED doctor, has not followed up    There are no active problems to display for this patient.  Past Surgical History:  Procedure Laterality Date  . INDUCED ABORTION     x 3   . TONSILLECTOMY     OB History    Gravida Para Term Preterm AB Living   1             SAB TAB Ectopic Multiple Live Births                 Home Medications    Prior to Admission medications   Medication Sig Start Date End Date Taking? Authorizing Provider  cephALEXin (KEFLEX) 500 MG capsule Take 1 capsule (500 mg total) by mouth 4 (four) times daily. Patient not taking: Reported on 02/26/2017 10/20/16   Dahlia Client Muthersbaugh, PA-C  cyclobenzaprine (FLEXERIL) 5 MG tablet Take 5 mg by mouth 3 (three) times daily as needed for muscle spasms.    Historical Provider, MD  Doxylamine-Pyridoxine (DICLEGIS) 10-10 MG TBEC Take 10 mg by mouth daily.  03/18/17   Audry Pili, PA-C  Prenat-FeAsp-Meth-FA-DHA w/o A (PRENATE PIXIE) 10-0.6-0.4-200 MG CAPS Take 1 capsule by mouth daily. 02/26/17   Hermina Staggers, MD    Family History History reviewed. No pertinent family history.  Social History Social History  Substance Use Topics  . Smoking status: Never Smoker  . Smokeless tobacco: Never Used  . Alcohol use No     Allergies   Latex   Review of Systems Review of Systems  Constitutional: Negative for activity change, chills, diaphoresis, fatigue and fever.  HENT: Negative for congestion and rhinorrhea.   Eyes: Negative for visual disturbance.    Respiratory: Negative for cough, chest tightness, shortness of breath and stridor.   Cardiovascular: Negative for chest pain, palpitations and leg swelling.  Gastrointestinal: Positive for nausea and vomiting. Negative for abdominal distention, abdominal pain, constipation and diarrhea.  Genitourinary: Negative for difficulty urinating, dysuria, flank pain, frequency, hematuria, menstrual problem, pelvic pain, vaginal bleeding and vaginal discharge.       Positive urinary incontinence during episodes of emesis  Musculoskeletal: Negative for back pain and neck pain.  Skin: Negative for rash and wound.  Neurological: Negative for dizziness, weakness, light-headedness, numbness and headaches.  Psychiatric/Behavioral: Negative for agitation and confusion.  All other systems reviewed and are negative.    Physical Exam Updated Vital Signs BP (!) 109/59   Pulse (!) 58   Temp 98 F (36.7 C) (Oral)   Resp 16   LMP 01/23/2017   SpO2 100%   Physical Exam  Constitutional: She is oriented to person, place, and time. She appears well-developed and well-nourished. No distress.  appears well and unhurt.  HENT:  Head: Normocephalic and atraumatic.  Right Ear: External ear normal.  Left Ear: External ear normal.  Nose: Nose normal.  Mouth/Throat: Oropharynx is clear and moist. No oropharyngeal exudate.  Eyes: Conjunctivae and EOM are normal. Pupils are equal, round, and reactive to light.  Neck: Normal range of motion. Neck supple.  Cardiovascular: Normal rate, normal heart sounds and intact distal pulses.   No murmur heard. Pulmonary/Chest: Effort normal and breath sounds normal. No stridor. No respiratory distress. She has no wheezes. She has no rales. She exhibits no tenderness.  Abdominal: Soft. She exhibits no distension. There is no tenderness. There is no rebound and no guarding.  Musculoskeletal: She exhibits no tenderness.   No cva tenderness  Neurological: She is alert and oriented  to person, place, and time. She has normal reflexes. No sensory deficit. She exhibits normal muscle tone.  Skin: Skin is warm. Capillary refill takes less than 2 seconds. No rash noted. She is not diaphoretic. No erythema.  Psychiatric: She has a normal mood and affect.  Nursing note and vitals reviewed.    ED Treatments / Results  DIAGNOSTIC STUDIES:  Oxygen Saturation is 100% on RA, normal by my interpretation.    COORDINATION OF CARE:  12:33 PM Discussed treatment plan with pt at bedside including UA, IV Fluids, and blood work and pt agreed to plan.  2:12 PM Pt re-evaluated with discussion about lab results.   Labs (all labs ordered are listed, but only abnormal results are displayed) Labs Reviewed  URINALYSIS, ROUTINE W REFLEX MICROSCOPIC - Abnormal; Notable for the following:       Result Value   Ketones, ur 80 (*)    All other components within normal limits  COMPREHENSIVE METABOLIC PANEL - Abnormal; Notable for the following:    Alkaline Phosphatase 29 (*)    All  other components within normal limits  CBC WITH DIFFERENTIAL/PLATELET - Abnormal; Notable for the following:    Hemoglobin 11.7 (*)    HCT 34.8 (*)    MCV 71.3 (*)    MCH 24.0 (*)    All other components within normal limits  URINE CULTURE  LIPASE, BLOOD  I-STAT CG4 LACTIC ACID, ED  I-STAT CG4 LACTIC ACID, ED    EKG  EKG Interpretation None       Radiology No results found.  Procedures Procedures (including critical care time)  Medications Ordered in ED Medications  sodium chloride 0.9 % bolus 1,000 mL (0 mLs Intravenous Stopped 03/26/17 1421)  sodium chloride 0.9 % bolus 1,000 mL (0 mLs Intravenous Stopped 03/26/17 1421)  metoCLOPramide (REGLAN) injection 10 mg (10 mg Intravenous Given 03/26/17 1258)     Initial Impression / Assessment and Plan / ED Course  I have reviewed the triage vital signs and the nursing notes.  Pertinent labs & imaging results that were available during my care of  the patient were reviewed by me and considered in my medical decision making (see chart for details).      Caitlin Hartman is a 26 y.o. female G7P4A2 currently 8 weeks, 6 days pregnant with a PMhx of RA, Lyme Disease, and Fibromyalgia, who presents to the Emergency Department complaining of persistent emesis onset one week ago and has not tolerated fluids for the last 2 days.   History and exam are seen above.  On exam, patient's abdomen is completely nontender. Normal bowel sounds. Chest nontender. Lungs clear. No CVA tenderness. No rashes seen. Normal pulses throughout.  Patient reports that she was able to get approval by her primary OB/GYN to improve the nausea medication prescription previously prescribed. She reports that she was told to come to the ED for fluids and evaluation.  Patient will be given fluids, nausea medication, and have screening blood work and urinalysis performed to look for UTI or organ dysfunction in the setting of her week of vomiting.  Patient's vital signs were unremarkable on arrival. Anticipate reassessment after fluids with likely discharge.  Patient's screening laboratory testing was unremarkable. No evidence of urinary tract infection. Lactic acid nonelevated. No leukocytosis. Hemoglobin slightly decreased from prior with similar.  Patient reported complete resolution of nausea after Reglan. Patient able to tolerate crackers, liquids, and peanut butter. Patient stayed and was able to give most of her fluids and before needing to leave to pick up her children. Patient will fill the prescription supranasally had prescribed for her nausea. Patient understands follow-up instructions with her PCP and OB/GYN team as well as return precautions for worsening symptoms. Patient had no questions or concerns and patient was discharged in good condition.   Final Clinical Impressions(s) / ED Diagnoses   Final diagnoses:  Nausea and vomiting during pregnancy prior to [redacted]  weeks gestation    New Prescriptions Discharge Medication List as of 03/26/2017  2:19 PM      I personally performed the services described in this documentation, which was scribed in my presence. The recorded information has been reviewed and is accurate.  Clinical Impression: 1. Nausea and vomiting during pregnancy prior to [redacted] weeks gestation     Disposition: Discharge  Condition: Good  I have discussed the results, Dx and Tx plan with the pt(& family if present). He/she/they expressed understanding and agree(s) with the plan. Discharge instructions discussed at great length. Strict return precautions discussed and pt &/or family have verbalized understanding of the instructions.  No further questions at time of discharge.    Discharge Medication List as of 03/26/2017  2:19 PM      Follow Up: Rayetta Humphrey, MD [address] [place] [address] [phone]  Schedule an appointment as soon as possible for a visit    [facility] EMERGENCY DEPARTMENT [address] [id number] mc [place] [address] [phone]  If symptoms worsen      Heide Scales, MD 03/26/17 [phone]

## 2017-03-26 NOTE — Discharge Instructions (Signed)
Please filled her prescription that you were provided during her last visit to help with her nausea. We rehydrated G today and look for signs of infection. We did not find evidence of infections. Please follow-up with your PCP and your OB/GYN team. If any symptoms change or worsen, please return to the nearest emergency department.

## 2017-03-26 NOTE — ED Triage Notes (Signed)
Pt states she has had emesis with pregnancy. Seen here for same with improvement but symptoms returned yesterday. Pt states she is unable to keep any liquid down. Skin warm and dry.

## 2017-03-27 LAB — URINE CULTURE

## 2017-04-03 ENCOUNTER — Encounter: Payer: Medicaid Other | Admitting: Certified Nurse Midwife

## 2017-04-07 ENCOUNTER — Other Ambulatory Visit (HOSPITAL_COMMUNITY)
Admission: RE | Admit: 2017-04-07 | Discharge: 2017-04-07 | Disposition: A | Discharge status: Home / Self Care | Payer: Medicaid Other | Source: Ambulatory Visit | Attending: Certified Nurse Midwife | Admitting: Certified Nurse Midwife

## 2017-04-07 ENCOUNTER — Encounter: Payer: Self-pay | Admitting: Certified Nurse Midwife

## 2017-04-07 ENCOUNTER — Ambulatory Visit (INDEPENDENT_AMBULATORY_CARE_PROVIDER_SITE_OTHER): Payer: Medicaid Other | Admitting: Certified Nurse Midwife

## 2017-04-07 VITALS — BP 134/84 | HR 83 | Temp 97.7°F | Wt 162.5 lb

## 2017-04-07 DIAGNOSIS — J4520 Mild intermittent asthma, uncomplicated: Secondary | ICD-10-CM

## 2017-04-07 DIAGNOSIS — O0990 Supervision of high risk pregnancy, unspecified, unspecified trimester: Secondary | ICD-10-CM | POA: Insufficient documentation

## 2017-04-07 DIAGNOSIS — I011 Acute rheumatic endocarditis: Secondary | ICD-10-CM

## 2017-04-07 DIAGNOSIS — J45909 Unspecified asthma, uncomplicated: Secondary | ICD-10-CM | POA: Insufficient documentation

## 2017-04-07 DIAGNOSIS — R519 Headache, unspecified: Secondary | ICD-10-CM | POA: Insufficient documentation

## 2017-04-07 DIAGNOSIS — R0600 Dyspnea, unspecified: Secondary | ICD-10-CM | POA: Insufficient documentation

## 2017-04-07 DIAGNOSIS — Z3481 Encounter for supervision of other normal pregnancy, first trimester: Secondary | ICD-10-CM

## 2017-04-07 DIAGNOSIS — Z3490 Encounter for supervision of normal pregnancy, unspecified, unspecified trimester: Secondary | ICD-10-CM

## 2017-04-07 DIAGNOSIS — R51 Headache: Secondary | ICD-10-CM

## 2017-04-07 DIAGNOSIS — M797 Fibromyalgia: Secondary | ICD-10-CM | POA: Insufficient documentation

## 2017-04-07 MED ORDER — ASPIRIN 81 MG PO CHEW: 81.0000 mg | CHEWABLE_TABLET | Freq: Every day | ORAL | 12 refills | Status: DC

## 2017-04-07 NOTE — Progress Notes (Signed)
Patient presents for NEW OB visit. Patient complains of nausea and vomiting.

## 2017-04-07 NOTE — Progress Notes (Signed)
Subjective:    Caitlin Hartman is being seen today for her first obstetrical visit.  This is not a planned pregnancy. She is at [redacted]w[redacted]d gestation. Her obstetrical history is significant for gestational hypertension in prev pregnancy, abnormal pap smear, anemia, asthma ,dyspnea, fibromyalgia, headaches, and relationship with FOB: significant other, living together, present at visit. Patient does intend to breast feed and plans for a tubal ligation after delivery. Pregnancy history fully reviewed.  The information documented in the HPI was reviewed and verified.  Menstrual History: OB History    Gravida Para Term Preterm AB Living   0 3 3   SAB TAB Ectopic Multiple Live Births                  Menarche age: 36 Patient's last menstrual period was 01/23/2017 (exact date).    Past Medical History:  Diagnosis Date  . Anemia   . Asthma   . Dyspnea   . Fibromyalgia   . Headache   . Hypertension   . Lyme disease 2013  . Mental disorder   . 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 Vaginal Pap smear, abnormal     Past Surgical History:  Procedure Laterality Date  . INDUCED ABORTION     x 3   . TONSILLECTOMY       (Not in a hospital admission) Allergies  Allergen Reactions  . Latex Hives  . Oxycodone-Acetaminophen Nausea And Vomiting    Social History  Substance Use Topics  . Smoking status: Never Smoker  . Smokeless tobacco: Never Used  . Alcohol use No    Family History  Problem Relation Age of Onset  . Alcohol abuse Mother   . Drug abuse Mother   . Depression Mother   . Mental illness Mother   . Drug abuse Father   . Cancer Maternal Aunt   . Diabetes Maternal Uncle   . Alcohol abuse Maternal Grandmother   . Cancer Maternal Grandmother   . Diabetes Maternal Grandmother   . Varicose Veins Maternal Grandmother   . Drug abuse Maternal Grandfather   . Cancer Paternal Grandmother      Review of Systems Constitutional:  negative for weight loss Gastrointestinal: negative for vomiting Genitourinary:negative for genital lesions and vaginal discharge and dysuria Musculoskeletal:negative for back pain Behavioral/Psych: negative for abusive relationship, depression, illegal drug usage and tobacco use    Objective:    BP 134/84   Pulse 83   Temp 97.7 F (36.5 C)   Wt 162 lb 8 oz (73.7 kg)   LMP 01/23/2017 (Exact Date)   BMI 24.71 kg/m  General Appearance:    Alert, cooperative, no distress, appears stated age  Head:    Normocephalic, without obvious abnormality, atraumatic  Eyes:    PERRL, conjunctiva/corneas clear, EOM's intact, fundi    benign, both eyes  Ears:    Normal TM's and external ear canals, both ears  Nose:   Nares normal, septum midline, mucosa normal, no drainage    or sinus tenderness  Throat:   Lips, mucosa, and tongue normal; teeth and gums normal  Neck:   Supple, symmetrical, trachea midline, no adenopathy;    thyroid:  no enlargement/tenderness/nodules; no carotid   bruit or JVD  Back:     Symmetric, no curvature, ROM normal, no CVA tenderness  Lungs:     Clear to auscultation bilaterally, respirations unlabored  Chest Wall:  No tenderness or deformity   Heart:    Regular rate and rhythm, S1 and S2 normal, no murmur, rub   or gallop  Breast Exam:    No tenderness, masses, or nipple abnormality  Abdomen:     Soft, non-tender, bowel sounds active all four quadrants,    no masses, no organomegaly  Genitalia:    Normal female without lesion, discharge or tenderness  Extremities:   Extremities normal, atraumatic, no cyanosis or edema   Pulses:   2+ and symmetric all extremities  Skin:   Skin color, texture, turgor normal, no rashes or lesions  Lymph nodes:   Cervical, supraclavicular, and axillary nodes normal  Neurologic:   CNII-XII intact, normal strength, sensation and reflexes    Throughout Cervix,closed, long, anterior        Lab Review Urine pregnancy test Labs  reviewed yes Radiologic studies reviewed no Assessment:  Encounter for supervision of normal pregnancy, antepartum, unspecified gravidity -    Pregnancy at [redacted]w[redacted]d weeks   FHR,169,Doppler,S=D   Plan:    1. Encounter for supervision of normal pregnancy, antepartum, unspecified gravidity -   Plan: Obstetric Panel, Including HIV, Hemoglobinopathy evaluation, Cystic Fibrosis Mutation 97, Cervicovaginal ancillary only, Cytology - PAP,  HgB A1c, Varicella zoster antibody, IgG,  MaterniT21 PLUS Core+SCA,  Ambulatory referral to Rheumatology  2. Hx of gestational htn with previous pregnancy- aspirin 81 MG chewable tablet  3. Fibromyalgia - Plan: Ambulatory referral to Rheumatology   4.Rheumatoid arthritis - Plan: Ambulatory referral to Rheumatology  5. Mild intermittent asthma without complication- No complications at this time    Prenatal vitamins.  Counseling provided regarding continued use of seat belts, cessation of alcohol consumption, smoking or use of illicit drugs; infection precautions i.e., influenza/TDAP immunizations, toxoplasmosis,CMV, parvovirus, listeria and varicella; workplace safety, exercise during pregnancy; routine dental care, safe medications, sexual activity, hot tubs, saunas, pools, travel, caffeine use, fish and methlymercury, potential toxins, hair treatments, varicose veins Weight gain recommendations per IOM guidelines reviewed: underweight/BMI< 18.5--> gain 28 - 40 lbs; normal weight/BMI 18.5 - 24.9--> gain 25 - 35 lbs; overweight/BMI 25 - 29.9--> gain 15 - 25 lbs; obese/BMI >30->gain  11 - 20 lbs Problem list reviewed and updated. FIRST/CF mutation testing/NIPT/QUAD SCREEN/fragile X/Ashkenazi Jewish population testing/Spinal muscular atrophy discussed: ordered. Role of ultrasound in pregnancy discussed; fetal survey: requested. Amniocentesis discussed: requested.  Meds ordered this encounter  Medications  . aspirin 81 MG chewable tablet    Sig: Chew 1  tablet (81 mg total) by mouth daily.    Dispense:  30 tablet    Refill:  12   Orders Placed This Encounter  Procedures  . Obstetric Panel, Including HIV  . Hemoglobinopathy evaluation  . Cystic Fibrosis Mutation 97  . HgB A1c  . Varicella zoster antibody, IgG  . MaterniT21 PLUS Core+SCA    Order Specific Question:   Is the patient insulin dependent?    Answer:   No    Order Specific Question:   Please enter gestational age. This should be expressed as weeks AND days, i.e. 16w 6d. Enter weeks here. Enter days in next question.    Answer:   46    Order Specific Question:   Please enter gestational age. This should be expressed as weeks AND days, i.e. 16w 6d. Enter days here. Enter weeks in previous question.    Answer:   4    Order Specific Question:   How was gestational age calculated?    Answer:   LMP  Order Specific Question:   Please give the date of LMP OR Ultrasound OR Estimated date of delivery.    Answer:   10/30/2017    Order Specific Question:   Number of Fetuses (Type of Pregnancy):    Answer:   1    Order Specific Question:   Indications for performing the test? (please choose all that apply):    Answer:   Routine screening    Order Specific Question:   Other Indications? (Y=Yes, N=No)    Answer:   Y    Order Specific Question:   Please specify other indications, if any:    Answer:   hx of arthritis, hypertension, lyme disease    Order Specific Question:   If this is a repeat specimen, please indicate the reason:    Answer:   Not indicated    Order Specific Question:   Please specify the patient's race: (C=White/Caucasion, B=Black, I=Native American, A=Asian, H=Hispanic, O=Other, U=Unknown)    Answer:   B    Order Specific Question:   Donor Egg - indicate if the egg was obtained from in vitro fertilization.    Answer:   N    Order Specific Question:   Age of Egg Donor.    Answer:   85    Order Specific Question:   Prior Down Syndrome/ONTD screening during current  pregnancy.    Answer:   N    Order Specific Question:   Prior First Trimester Testing    Answer:   N    Order Specific Question:   Prior Second Trimester Testing    Answer:   N    Order Specific Question:   Family History of Neural Tube Defects    Answer:   N    Order Specific Question:   Prior Pregnancy with Down Syndrome    Answer:   N    Order Specific Question:   Please give the patient's weight (in pounds)    Answer:   173  . Ambulatory referral to Rheumatology    Referral Priority:   Routine    Referral Type:   Consultation    Referral Reason:   Specialty Services Required    Requested Specialty:   Rheumatology    Number of Visits Requested:   1    Follow up in 4 weeks. 75% of 30 min visit spent on counseling and coordination of care.

## 2017-04-07 NOTE — Patient Instructions (Signed)
AREA PEDIATRIC/FAMILY PRACTICE PHYSICIANS  [facility] [address], [address] [place], [place]  [address] Phone - [phone]   Fax - [phone]  [facility] [address] [place], [place] [address] Phone - [phone]   Fax - [phone]  [facility] [address] [place], [place]  [address] Phone - [phone]   Fax - [phone]  [facility] [address] [place], [place]  [address] Phone - [phone]   Fax - [phone]  [facility] [address] [place], [place]  [address] Phone - [phone]   Fax - [phone]  [facility] [address] [place], [place]  [address] Phone - [phone]   Fax - [phone]  [facility] [address] [place], [place]  [address] Phone - [phone]   Fax - [phone]  [facility] [address] [place], [place]  [address] Phone - [phone]   Fax - [phone]  [facility] [address] [place], [place]  [address] Phone - [phone]   Fax - [phone] [facility] [address] [place], [place]  [address] Phone - [phone]   Fax - [phone]  [facility] [address] [place], [place]  [address] Phone - [phone]   Fax - [phone]  [facility] [address] [place], [place]  [address] Phone - [phone]   Fax - [phone]  [facility] [address] [place], [place]  [address] Phone - [phone]   Fax - [phone]  [facility] G[facility] [address] [place], [place]  [address] Phone - [phone]  [facility] [address] [employer], [place]  [address] Phone - [phone]   Fax - [phone]  [facility] [address] [place], [place]  [address] Phone - [phone]   Fax - [phone]  [facility] [address] [place], [place]  [address] Phone - [phone]   Fax - [phone]  [facility] [address] [place], [place]  [address] Phone - [phone]   Fax - [phone]   [facility] [address] [place], [place]  [address] Phone - [phone]   Fax - [phone]  [facility] [address] [place], [place]  [address] Phone - [phone]   Fax - [phone]  [facility] [address] [place], [place]  [address] Phone - [phone]   Fax - [phone]  [facility] [address] [place], [place]  [address] Phone - [phone]   Fax - [phone]  [facility] [address] [place], [place]  [address] Phone - [phone]   Fax - [phone] [facility] [address] [place], [place]  [address] Phone - [phone]   Fax - [phone]  [facility] [address] [place], [place]  [address] Phone - [phone]   Fax - [phone]  [facility] [address] [place],   [address] Phone - [phone]   Fax - [phone]  [facility]  Charlene Flemming MD [address]   [address] Phone [phone]  Fax [phone]   

## 2017-04-08 ENCOUNTER — Encounter: Payer: Self-pay | Admitting: Certified Nurse Midwife

## 2017-04-08 LAB — CERVICOVAGINAL ANCILLARY ONLY
BACTERIAL VAGINITIS: POSITIVE — AB
Candida vaginitis: NEGATIVE
Chlamydia: NEGATIVE
NEISSERIA GONORRHEA: NEGATIVE
Trichomonas: NEGATIVE

## 2017-04-09 LAB — CYTOLOGY - PAP
Adequacy: ABSENT
DIAGNOSIS: NEGATIVE

## 2017-04-10 ENCOUNTER — Other Ambulatory Visit: Payer: Self-pay | Admitting: Certified Nurse Midwife

## 2017-04-10 DIAGNOSIS — B9689 Other specified bacterial agents as the cause of diseases classified elsewhere: Secondary | ICD-10-CM

## 2017-04-10 DIAGNOSIS — N760 Acute vaginitis: Principal | ICD-10-CM

## 2017-04-10 DIAGNOSIS — Z3480 Encounter for supervision of other normal pregnancy, unspecified trimester: Secondary | ICD-10-CM

## 2017-04-10 LAB — OBSTETRIC PANEL, INCLUDING HIV
Antibody Screen: NEGATIVE
BASOS ABS: 0 10*3/uL (ref 0.0–0.2)
Basos: 1 %
EOS (ABSOLUTE): 0.1 10*3/uL (ref 0.0–0.4)
Eos: 1 %
HEP B S AG: NEGATIVE
HIV Screen 4th Generation wRfx: NONREACTIVE
Hematocrit: 35.3 % (ref 34.0–46.6)
Hemoglobin: 11.7 g/dL (ref 11.1–15.9)
Immature Grans (Abs): 0 10*3/uL (ref 0.0–0.1)
Immature Granulocytes: 0 %
Lymphocytes Absolute: 1.1 10*3/uL (ref 0.7–3.1)
Lymphs: 24 %
MCH: 23.9 pg — ABNORMAL LOW (ref 26.6–33.0)
MCHC: 33.1 g/dL (ref 31.5–35.7)
MCV: 72 fL — AB (ref 79–97)
MONOCYTES: 8 %
Monocytes Absolute: 0.3 10*3/uL (ref 0.1–0.9)
NEUTROS ABS: 2.9 10*3/uL (ref 1.4–7.0)
Neutrophils: 66 %
PLATELETS: 205 10*3/uL (ref 150–379)
RBC: 4.9 x10E6/uL (ref 3.77–5.28)
RDW: 14.8 % (ref 12.3–15.4)
RH TYPE: POSITIVE
RPR: NONREACTIVE
RUBELLA: 3.03 {index} (ref >0.99)
WBC: 4.4 10*3/uL (ref 3.4–10.8)

## 2017-04-10 LAB — HEMOGLOBINOPATHY EVALUATION
HEMOGLOBIN A2 QUANTITATION: 2.4 % (ref 1.8–3.2)
HEMOGLOBIN F QUANTITATION: 0 % (ref 0.0–2.0)
HGB C: 0 %
HGB S: 0 %
HGB VARIANT: 0 %
Hgb A: 97.6 % (ref 96.4–98.8)

## 2017-04-10 LAB — HEMOGLOBIN A1C
ESTIMATED AVERAGE GLUCOSE: 97 mg/dL
Hgb A1c MFr Bld: 5 % (ref 4.8–5.6)

## 2017-04-10 LAB — CYSTIC FIBROSIS MUTATION 97: Interpretation: NOT DETECTED

## 2017-04-10 LAB — VARICELLA ZOSTER ANTIBODY, IGG: VARICELLA: 1924 {index} (ref >165)

## 2017-04-10 MED ORDER — METRONIDAZOLE 0.75 % VA GEL: 1.0000 | Freq: Two times a day (BID) | VAGINAL | 0 refills | Status: DC

## 2017-04-11 LAB — MATERNIT21 PLUS CORE+SCA
Chromosome 13: NEGATIVE
Chromosome 18: NEGATIVE
Chromosome 21: NEGATIVE
Y CHROMOSOME: NOT DETECTED

## 2017-04-15 ENCOUNTER — Other Ambulatory Visit: Payer: Self-pay | Admitting: Certified Nurse Midwife

## 2017-04-15 DIAGNOSIS — Z3480 Encounter for supervision of other normal pregnancy, unspecified trimester: Secondary | ICD-10-CM

## 2017-04-16 ENCOUNTER — Other Ambulatory Visit: Payer: Self-pay | Admitting: Certified Nurse Midwife

## 2017-04-16 ENCOUNTER — Encounter: Payer: Self-pay | Admitting: Certified Nurse Midwife

## 2017-04-16 ENCOUNTER — Encounter: Payer: Self-pay | Admitting: Family Medicine

## 2017-04-16 DIAGNOSIS — R768 Other specified abnormal immunological findings in serum: Secondary | ICD-10-CM | POA: Insufficient documentation

## 2017-04-16 DIAGNOSIS — E559 Vitamin D deficiency, unspecified: Secondary | ICD-10-CM | POA: Insufficient documentation

## 2017-04-16 DIAGNOSIS — O0990 Supervision of high risk pregnancy, unspecified, unspecified trimester: Secondary | ICD-10-CM

## 2017-04-16 DIAGNOSIS — R7989 Other specified abnormal findings of blood chemistry: Secondary | ICD-10-CM | POA: Insufficient documentation

## 2017-04-16 MED ORDER — VITAMIN D (ERGOCALCIFEROL) 1.25 MG (50000 UNIT) PO CAPS
50000.0000 [IU] | ORAL_CAPSULE | ORAL | 2 refills | Status: DC
Start: 2017-04-16 — End: 2017-08-28

## 2017-04-17 ENCOUNTER — Telehealth: Payer: Self-pay | Admitting: *Deleted

## 2017-04-17 NOTE — Telephone Encounter (Signed)
Pt called to office to discuss lab results. Pt states she saw results of her Materni21 and would like to know sex of baby as she was unsure after viewing results online. Pt made aware of results given.  Pt states she is also having stuffiness and would like to know what to take. Pt advised if she feels seasonal allergies, she may take Claritin, Tylenol as needed. Pt made aware if symptoms do not improve after several days use she should contact office for further recommendations.  Pt states understanding.

## 2017-04-22 ENCOUNTER — Telehealth: Payer: Self-pay | Admitting: *Deleted

## 2017-04-22 ENCOUNTER — Other Ambulatory Visit: Payer: Self-pay | Admitting: Certified Nurse Midwife

## 2017-04-22 DIAGNOSIS — B3731 Acute candidiasis of vulva and vagina: Secondary | ICD-10-CM

## 2017-04-22 DIAGNOSIS — B373 Candidiasis of vulva and vagina: Secondary | ICD-10-CM

## 2017-04-22 MED ORDER — TERCONAZOLE 0.8 % VA CREA: 1.0000 | TOPICAL_CREAM | Freq: Every day | VAGINAL | 0 refills | Status: DC

## 2017-04-22 NOTE — Telephone Encounter (Signed)
Please let her know that terazole cream was sent to the pharmacy for her.  Please ask her if she had vaginal deliveries in the past or C-sections and update her OB record.  Thank you.  R.Denney CNM

## 2017-04-22 NOTE — Telephone Encounter (Signed)
Pt called to office stating she feels she has a yeast infection after using MetroGel. Pt state increase in thick chunky discharge. Pt advised message to provider for treatment choice.   Please advise.

## 2017-04-23 ENCOUNTER — Ambulatory Visit (HOSPITAL_COMMUNITY)
Admission: RE | Admit: 2017-04-23 | Discharge: 2017-04-23 | Disposition: A | Discharge status: Home / Self Care | Payer: Medicaid Other | Source: Ambulatory Visit | Attending: Certified Nurse Midwife | Admitting: Certified Nurse Midwife

## 2017-04-23 ENCOUNTER — Other Ambulatory Visit: Payer: Self-pay | Admitting: Certified Nurse Midwife

## 2017-04-23 ENCOUNTER — Encounter (HOSPITAL_COMMUNITY): Payer: Self-pay

## 2017-04-23 ENCOUNTER — Telehealth (HOSPITAL_COMMUNITY): Payer: Self-pay | Admitting: *Deleted

## 2017-04-23 DIAGNOSIS — Z3A12 12 weeks gestation of pregnancy: Secondary | ICD-10-CM

## 2017-04-23 DIAGNOSIS — Z3682 Encounter for antenatal screening for nuchal translucency: Secondary | ICD-10-CM

## 2017-04-23 DIAGNOSIS — O0990 Supervision of high risk pregnancy, unspecified, unspecified trimester: Secondary | ICD-10-CM

## 2017-04-23 NOTE — Addendum Note (Signed)
Encounter addended by: Vivien RotaSmall, Jaslyne Beeck H, RT on: 04/23/2017  9:22 AM<BR>    Actions taken: Imaging Exam ended

## 2017-04-23 NOTE — Telephone Encounter (Signed)
Pt made aware of Rx sent 

## 2017-04-23 NOTE — Telephone Encounter (Signed)
TC to pt to inform her that the blood work (first trimester screen) that was drawn this morning would not be sent.  Cell free DNA was already drawn and resulted at her office.  Pt voiced understanding, she had no questions.

## 2017-04-24 ENCOUNTER — Other Ambulatory Visit: Payer: Self-pay | Admitting: Certified Nurse Midwife

## 2017-04-24 DIAGNOSIS — O0990 Supervision of high risk pregnancy, unspecified, unspecified trimester: Secondary | ICD-10-CM

## 2017-05-06 ENCOUNTER — Ambulatory Visit (INDEPENDENT_AMBULATORY_CARE_PROVIDER_SITE_OTHER): Payer: Medicaid Other | Admitting: Obstetrics and Gynecology

## 2017-05-06 VITALS — BP 126/84 | HR 73 | Wt 165.0 lb

## 2017-05-06 DIAGNOSIS — R768 Other specified abnormal immunological findings in serum: Secondary | ICD-10-CM

## 2017-05-06 DIAGNOSIS — O0992 Supervision of high risk pregnancy, unspecified, second trimester: Secondary | ICD-10-CM

## 2017-05-06 DIAGNOSIS — I011 Acute rheumatic endocarditis: Secondary | ICD-10-CM

## 2017-05-06 DIAGNOSIS — M797 Fibromyalgia: Secondary | ICD-10-CM

## 2017-05-06 DIAGNOSIS — O0990 Supervision of high risk pregnancy, unspecified, unspecified trimester: Secondary | ICD-10-CM

## 2017-05-06 NOTE — Progress Notes (Signed)
Subjective:  Caitlin Hartman is a 26 y.o. [id number] at 474w5d being seen today for ongoing prenatal care.  She is currently monitored for the following issues for this high-risk pregnancy and has Supervision of high risk pregnancy, antepartum; Fibromyalgia; Rheumatoid aortitis; Asthma; Vitamin D deficiency; ANA positive; and Low vitamin D level on her problem list.  Patient reports no complaints.  Contractions: Not present. Vag. Bleeding: None.  Movement: Present. Denies leaking of fluid.   The following portions of the patient's history were reviewed and updated as appropriate: allergies, current medications, past family history, past medical history, past social history, past surgical history and problem list. Problem list updated.  Objective:   Vitals:   05/06/17 1311  BP: 126/84  Pulse: 73  Weight: 165 lb (74.8 kg)    Fetal Status: Fetal Heart Rate (bpm): 156   Movement: Present     General:  Alert, oriented and cooperative. Patient is in no acute distress.  Skin: Skin is warm and dry. No rash noted.   Cardiovascular: Normal heart rate noted  Respiratory: Normal respiratory effort, no problems with respiration noted  Abdomen: Soft, gravid, appropriate for gestational age. Pain/Pressure: Absent     Pelvic:  Cervical exam deferred        Extremities: Normal range of motion.  Edema: None  Mental Status: Normal mood and affect. Normal behavior. Normal judgment and thought content.   Urinalysis:      Assessment and Plan:  Pregnancy: [id number] at 1974w5d  1. Supervision of high risk pregnancy, antepartum Stable Anatomy scan ordered  2. 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 ANA positive As above  4. Fibromyalgia As above  Preterm labor symptoms and general obstetric precautions including but not limited to vaginal bleeding, contractions, leaking of fluid and fetal movement  were reviewed in detail with the patient. Please refer to After Visit Summary for other counseling recommendations.  Return in about 4 weeks (around 06/03/2017) for OB visit.   Hermina StaggersErvin, Michael L, MD

## 2017-05-16 NOTE — Addendum Note (Signed)
Addended by: Dalphine HandingGARDNER, Trinh Sanjose L on: 05/16/2017 08:51 AM   Modules accepted: Orders

## 2017-06-03 ENCOUNTER — Encounter: Payer: Self-pay | Admitting: Obstetrics

## 2017-06-03 ENCOUNTER — Ambulatory Visit (INDEPENDENT_AMBULATORY_CARE_PROVIDER_SITE_OTHER): Payer: Medicaid Other | Admitting: Obstetrics and Gynecology

## 2017-06-03 VITALS — BP 109/73 | HR 69 | Wt 173.0 lb

## 2017-06-03 DIAGNOSIS — O0990 Supervision of high risk pregnancy, unspecified, unspecified trimester: Secondary | ICD-10-CM

## 2017-06-03 DIAGNOSIS — I011 Acute rheumatic endocarditis: Secondary | ICD-10-CM

## 2017-06-03 DIAGNOSIS — O0992 Supervision of high risk pregnancy, unspecified, second trimester: Secondary | ICD-10-CM

## 2017-06-03 DIAGNOSIS — R768 Other specified abnormal immunological findings in serum: Secondary | ICD-10-CM

## 2017-06-03 NOTE — Progress Notes (Signed)
   PRENATAL VISIT NOTE  Subjective:  Caitlin Hartman is a 26 y.o. [phone] at 6066w5d being seen today for ongoing prenatal care.  She is currently monitored for the following issues for this high-risk pregnancy and has Supervision of high risk pregnancy, antepartum; Fibromyalgia; Rheumatoid aortitis; Asthma; Vitamin D deficiency; ANA positive; and Low vitamin D level on her problem list.  Patient reports no complaints.  Contractions: Not present. Vag. Bleeding: None.  Movement: Present. Denies leaking of fluid.   The following portions of the patient's history were reviewed and updated as appropriate: allergies, current medications, past family history, past medical history, past social history, past surgical history and problem list. Problem list updated.  Objective:   Vitals:   06/03/17 1312  BP: 109/73  Pulse: 69  Weight: 173 lb (78.5 kg)    Fetal Status: Fetal Heart Rate (bpm): 156   Movement: Present     General:  Alert, oriented and cooperative. Patient is in no acute distress.  Skin: Skin is warm and dry. No rash noted.   Cardiovascular: Normal heart rate noted  Respiratory: Normal respiratory effort, no problems with respiration noted  Abdomen: Soft, gravid, appropriate for gestational age. Pain/Pressure: Present     Pelvic:  Cervical exam deferred        Extremities: Normal range of motion.  Edema: None  Mental Status: Normal mood and affect. Normal behavior. Normal judgment and thought content.   Assessment and Plan:  Pregnancy: [id number] at 4066w5d  1. Supervision of high risk pregnancy, antepartum Patient is doing well Anatomy ultrasound ordered - AFP, Serum, Open Spina Bifida - US MFM OB DETAIL +14 WK; Future  2. ANA positive Continue ASA Patient is still waiting on rheumatology appointment. Upon questioning, patient states that she doesn't have rheumatoid arthritis but rather fibromyalgia as a result of lyme disease. Patient states that diagnosis was [place] by [place]  [place] in [place].  - US MFM OB DETAIL +14 WK; Future  3. Rheumatoid aortitis See above  General obstetric precautions including but not limited to vaginal bleeding, contractions, leaking of fluid and fetal movement were reviewed in detail with the patient. Please refer to After Visit Summary for other counseling recommendations.  Return in about 4 weeks (around 07/01/2017) for ROB.   Catalina AntiguaPeggy Jaclyne Haverstick, MD

## 2017-06-07 LAB — AFP, SERUM, OPEN SPINA BIFIDA
AFP MoM: 0.9
AFP VALUE AFPOSL: 41.4 ng/mL
Gest. Age on Collection Date: 18.5 weeks
MATERNAL AGE AT EDD: 28.1 a
OSBR Risk 1 IN: 10000
Test Results:: NEGATIVE
WEIGHT: 173 [lb_av]

## 2017-06-20 ENCOUNTER — Ambulatory Visit (HOSPITAL_COMMUNITY)
Admission: RE | Admit: 2017-06-20 | Discharge: 2017-06-20 | Disposition: A | Discharge status: Home / Self Care | Payer: Medicaid Other | Source: Ambulatory Visit | Attending: Obstetrics and Gynecology | Admitting: Obstetrics and Gynecology

## 2017-06-20 ENCOUNTER — Encounter (HOSPITAL_COMMUNITY): Payer: Self-pay

## 2017-06-20 DIAGNOSIS — O99512 Diseases of the respiratory system complicating pregnancy, second trimester: Secondary | ICD-10-CM | POA: Insufficient documentation

## 2017-06-20 DIAGNOSIS — Z3A21 21 weeks gestation of pregnancy: Secondary | ICD-10-CM | POA: Diagnosis not present

## 2017-06-20 DIAGNOSIS — O2692 Pregnancy related conditions, unspecified, second trimester: Secondary | ICD-10-CM | POA: Insufficient documentation

## 2017-06-20 DIAGNOSIS — R768 Other specified abnormal immunological findings in serum: Secondary | ICD-10-CM

## 2017-06-20 DIAGNOSIS — O0990 Supervision of high risk pregnancy, unspecified, unspecified trimester: Secondary | ICD-10-CM

## 2017-06-20 DIAGNOSIS — R7689 Other specified abnormal immunological findings in serum: Secondary | ICD-10-CM

## 2017-06-20 DIAGNOSIS — Z3689 Encounter for other specified antenatal screening: Secondary | ICD-10-CM | POA: Insufficient documentation

## 2017-06-22 ENCOUNTER — Encounter (HOSPITAL_COMMUNITY): Payer: Self-pay

## 2017-06-22 ENCOUNTER — Emergency Department (HOSPITAL_COMMUNITY)
Admission: EM | Admit: 2017-06-22 | Discharge: 2017-06-22 | Disposition: A | Discharge status: Home / Self Care | Payer: Medicaid Other | Attending: Emergency Medicine | Admitting: Emergency Medicine

## 2017-06-22 DIAGNOSIS — Z7982 Long term (current) use of aspirin: Secondary | ICD-10-CM | POA: Insufficient documentation

## 2017-06-22 DIAGNOSIS — J45909 Unspecified asthma, uncomplicated: Secondary | ICD-10-CM | POA: Insufficient documentation

## 2017-06-22 DIAGNOSIS — Z91040 Latex allergy status: Secondary | ICD-10-CM | POA: Diagnosis not present

## 2017-06-22 DIAGNOSIS — Z79899 Other long term (current) drug therapy: Secondary | ICD-10-CM | POA: Insufficient documentation

## 2017-06-22 DIAGNOSIS — M79675 Pain in left toe(s): Secondary | ICD-10-CM | POA: Diagnosis not present

## 2017-06-22 DIAGNOSIS — I10 Essential (primary) hypertension: Secondary | ICD-10-CM | POA: Insufficient documentation

## 2017-06-22 MED ORDER — DICLOFENAC SODIUM 1 % TD GEL: 2.0000 g | Freq: Four times a day (QID) | TRANSDERMAL | 0 refills | Status: DC

## 2017-06-22 NOTE — ED Triage Notes (Signed)
[redacted] weeks pregnant.  Onset 2 weeks ago pt stubbed left 4th toe on table.  Toe is still painful.  Pt ambulated to triage with no difficulty.

## 2017-06-22 NOTE — ED Notes (Signed)
FHT 159bpm, OB RR notified.

## 2017-06-22 NOTE — ED Provider Notes (Signed)
[facility]-EMERGENCY DEPT Provider Note   CSN: [id number] Arrival date & time: 06/22/17  1546   By signing my name below, I, Clarisse Gouge, attest that this documentation has been prepared under the direction and in the presence of Telina Kleckley, PA-C. Electronically signed, Clarisse Gouge, ED Scribe. 06/22/17. 5:10 PM.   History   Chief Complaint Chief Complaint  Patient presents with  . Toe Pain   The history is provided by the patient and medical records. No language interpreter was used.    Caitlin Hartman is a 26 y.o. female with h/o fibromyalgia and RA presenting to the Emergency Department concerning L 4th toe pain onset 2 weeks ago after stubbing it against a table. She states the pain went away and then returned with associated swelling 2 days ago while she was walking. She described a 9/10 constant ache in triage worse with contact, ambulation and application of pressure. No PTA medication. Pt [redacted] weeks pregnant. She states she wears shoes with minimal support normally. Pt ambulatory without difficulty. She denies reinjury. She denies pain in her foot, ankle, or other toes. She has not taken anything for her pain, and she is immune to Tylenol and cannot take NSAIDs due to pregnancy. She denies fevers, chill, nor decrease immune system. No other complaints at this time.   Past Medical History:  Diagnosis Date  . Anemia   . Asthma   . Dyspnea   . Fibromyalgia   . Headache   . Hypertension   . Lyme disease 2013  . Mental disorder   . 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 Vaginal Pap smear, abnormal     Patient Active Problem List   Diagnosis Date Noted  . Vitamin D deficiency 04/16/2017  . ANA positive 04/16/2017  . Low vitamin D level 04/16/2017  . Supervision of high risk pregnancy, antepartum 04/07/2017  . Fibromyalgia 04/07/2017  . Asthma 04/07/2017  . 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 INDUCED ABORTION     x 3   . TONSILLECTOMY    . WISDOM TOOTH EXTRACTION      OB History    Gravida Para Term Preterm AB Living   7 3 3  0 3 3   SAB TAB Ectopic Multiple Live Births                   Home Medications    Prior to Admission medications   Medication Sig Start Date End Date Taking? Authorizing Provider  albuterol (PROVENTIL HFA;VENTOLIN HFA) 108 (90 Base) MCG/ACT inhaler Inhale into the lungs. 01/31/17 01/31/18  [provider]  aspirin 81 MG chewable tablet Chew 1 tablet (81 mg total) by mouth daily. 04/07/17   Orvilla Cornwall A, CNM  diclofenac sodium (VOLTAREN) 1 % GEL Apply 2 g topically 4 (four) times daily. 06/22/17   Jencarlos Nicolson, PA-C  Prenat-FeAsp-Meth-FA-DHA w/o A (PRENATE PIXIE) 10-0.6-0.4-200 MG CAPS Take 1 capsule by mouth daily. 02/26/17   Hermina Staggers, MD  PROAIR HFA 108 (90 Base) MCG/ACT inhaler INHALE 2 PUFFS BY MOUTH INTO THE LUNGS EVERY 6 HOURS AS NEEDED FOR WHEEZING 03/15/17   [provider]  Vitamin D, Ergocalciferol, (DRISDOL) 50000 units CAPS capsule Take 1 capsule (50,000 Units total) by mouth every 7 (seven) days. 04/16/17   Roe Coombs, CNM    Family History Family History  Problem Relation Age of Onset  .  Alcohol abuse Mother   . Drug abuse Mother   . Depression Mother   . Mental illness Mother   . Drug abuse Father   . Cancer Maternal Aunt   . Diabetes Maternal Uncle   . Alcohol abuse Maternal Grandmother   . Cancer Maternal Grandmother   . Diabetes Maternal Grandmother   . Varicose Veins Maternal Grandmother   . Drug abuse Maternal Grandfather   . Cancer Paternal Grandmother     Social History Social History  Substance Use Topics  . Smoking status: Never Smoker  . Smokeless tobacco: Never Used  . Alcohol use No     Allergies   Latex and Oxycodone-acetaminophen   Review of Systems Review of Systems  Constitutional: Negative for chills and fever.  Musculoskeletal: Positive  for arthralgias, gait problem and joint swelling.  Skin: Negative for color change and wound.  Neurological: Negative for weakness and numbness.  All other systems reviewed and are negative.    Physical Exam Updated Vital Signs BP 129/79   Pulse 89   Temp 98.6 F (37 C) (Oral)   Resp 16   LMP 01/23/2017 (Exact Date)   SpO2 96%   Physical Exam  Constitutional: She is oriented to person, place, and time. She appears well-developed and well-nourished.  HENT:  Head: Normocephalic.  Eyes: EOM are normal.  Neck: Normal range of motion.  Pulmonary/Chest: Effort normal.  Abdominal: She exhibits no distension.  Musculoskeletal: Normal range of motion.  Bruising and swelling of lateral 4th toe. No bruising or swelling extending into the 4th MTP. FROM of toes. No signs of laceration or open injury. Distal toe and nail without ecchymosis or swelling. Sensation of toe intact. Pedal pulses equal bilaterally. No TTP of the foot or tarsals. No TTP of 4th MTP joint.   Neurological: She is alert and oriented to person, place, and time.  Psychiatric: She has a normal mood and affect.  Nursing note and vitals reviewed.    ED Treatments / Results  DIAGNOSTIC STUDIES: Oxygen Saturation is 96% on RA, adequate by my interpretation.    COORDINATION OF CARE: 4:21 PM-Discussed next steps with pt. Pt verbalized understanding and is agreeable with the plan. Will buddy tape toe and provide topical medication.   Labs (all labs ordered are listed, but only abnormal results are displayed) Labs Reviewed - No data to display  EKG  EKG Interpretation None       Radiology No results found.  Procedures Procedures (including critical care time)  Medications Ordered in ED Medications - No data to display   Initial Impression / Assessment and Plan / ED Course  I have reviewed the triage vital signs and the nursing notes.  Pertinent labs & imaging results that were available during my care of  the patient were reviewed by me and considered in my medical decision making (see chart for details).     Patient presenting with fourth digit toe pain after stubbing her toe 2 weeks ago. Initially her symptoms improved, however 2 days ago, she felt increased pain and swelling. Physical exam shows some bruising and swelling of the lateral 4th digit. No involvement of the MTP or tarsal bone. Distal toe is nontender. Sensation of toe intact. Pt neurovascularly intact. Pt with either fx or [place] of toe. As she is pregnant, will not do imaging. Discussed with pt that the tx will be the same, with buddy tape and symptomatic management. Discussed option for topical pain relief, but that it is pregnancy  category C, and the risks involved. Pt states she understands, ands till wants the rx. Discussed that this could cause harm to her fetus, and pt states she would like rx. Will give voltaren gel Pt appears safe for discharge. Return precautions given. Pt states she understands and agrees to plan.   Final Clinical Impressions(s) / ED Diagnoses   Final diagnoses:  Toe pain, left    New Prescriptions Discharge Medication List as of 06/22/2017  4:29 PM    START taking these medications   Details  diclofenac sodium (VOLTAREN) 1 % GEL Apply 2 g topically 4 (four) times daily., Starting Sun 06/22/2017, Print       I personally performed the services described in this documentation, which was scribed in my presence. The recorded information has been reviewed and is accurate.    Alveria Apley, PA-C 06/22/17 1710    Mesner, Barbara Cower, MD 06/22/17 2009

## 2017-06-22 NOTE — Discharge Instructions (Signed)
Use gel as needed for pain.  Use buddy tape to help support your foot.  You may find further relief from a more study shoe.  Follow up with your primary care doctor if you have continue pain in 1 week.  Return to the ER if you develop numbness, tingling, or any new or worsening symptoms.

## 2017-06-30 ENCOUNTER — Ambulatory Visit (INDEPENDENT_AMBULATORY_CARE_PROVIDER_SITE_OTHER): Payer: Medicaid Other | Admitting: Obstetrics and Gynecology

## 2017-06-30 VITALS — BP 127/71 | HR 72 | Wt 187.0 lb

## 2017-06-30 DIAGNOSIS — O0990 Supervision of high risk pregnancy, unspecified, unspecified trimester: Secondary | ICD-10-CM

## 2017-06-30 DIAGNOSIS — O0992 Supervision of high risk pregnancy, unspecified, second trimester: Secondary | ICD-10-CM

## 2017-06-30 NOTE — Progress Notes (Signed)
   PRENATAL VISIT NOTE  Subjective:  Caitlin Hartman is a 26 y.o. [phone] at 289w4d being seen today for ongoing prenatal care.  She is currently monitored for the following issues for this low-risk pregnancy and has Supervision of high risk pregnancy, antepartum; Fibromyalgia; Rheumatoid aortitis; Asthma; Vitamin D deficiency; ANA positive; and Low vitamin D level on her problem list.  Patient reports no complaints.  Contractions: Not present. Vag. Bleeding: None.  Movement: Present. Denies leaking of fluid.   The following portions of the patient's history were reviewed and updated as appropriate: allergies, current medications, past family history, past medical history, past social history, past surgical history and problem list. Problem list updated.  Objective:   Vitals:   06/30/17 1309  BP: 127/71  Pulse: 72  Weight: 187 lb (84.8 kg)    Fetal Status: Fetal Heart Rate (bpm): 144 (Simultaneous filing. User may not have seen previous data.)   Movement: Present     General:  Alert, oriented and cooperative. Patient is in no acute distress.  Skin: Skin is warm and dry. No rash noted.   Cardiovascular: Normal heart rate noted  Respiratory: Normal respiratory effort, no problems with respiration noted  Abdomen: Soft, gravid, appropriate for gestational age.  Pain/Pressure: Absent     Pelvic: Cervical exam deferred        Extremities: Normal range of motion.  Edema: None  Mental Status:  Normal mood and affect. Normal behavior. Normal judgment and thought content.   Assessment and Plan:  Pregnancy: [id number] at 719w4d  1. Supervision of high risk pregnancy, antepartum Patient is doing well without complaints Patient reports no pain related to fibromyalgia at this time Anatomy ultrasound reviewed with the patient Third trimester labs next visit  Preterm labor symptoms and general obstetric precautions including but not limited to vaginal bleeding, contractions, leaking of fluid and  fetal movement were reviewed in detail with the patient. Please refer to After Visit Summary for other counseling recommendations.  Return in about 4 weeks (around 07/28/2017) for ROB, 2 hr glucola next visit.   Catalina AntiguaPeggy Kellee Sittner, MD

## 2017-07-28 ENCOUNTER — Ambulatory Visit (INDEPENDENT_AMBULATORY_CARE_PROVIDER_SITE_OTHER): Payer: Medicaid Other | Admitting: Obstetrics and Gynecology

## 2017-07-28 ENCOUNTER — Other Ambulatory Visit: Payer: Medicaid Other

## 2017-07-28 VITALS — BP 134/80 | HR 84 | Wt 194.0 lb

## 2017-07-28 DIAGNOSIS — O0990 Supervision of high risk pregnancy, unspecified, unspecified trimester: Secondary | ICD-10-CM

## 2017-07-28 DIAGNOSIS — R768 Other specified abnormal immunological findings in serum: Secondary | ICD-10-CM

## 2017-07-28 DIAGNOSIS — O0992 Supervision of high risk pregnancy, unspecified, second trimester: Secondary | ICD-10-CM

## 2017-07-28 NOTE — Progress Notes (Signed)
   PRENATAL VISIT NOTE  Subjective:  Caitlin Hartman is a 26 y.o. [phone] at [redacted]w[redacted]d being seen today for ongoing prenatal care.  She is currently monitored for the following issues for this high-risk pregnancy and has Supervision of high risk pregnancy, antepartum; Fibromyalgia; Rheumatoid aortitis; Asthma; Vitamin D deficiency; ANA positive; and Low vitamin D level on her problem list.  Patient reports no complaints.  Contractions: Not present. Vag. Bleeding: None.  Movement: Present. Denies leaking of fluid.   The following portions of the patient's history were reviewed and updated as appropriate: allergies, current medications, past family history, past medical history, past social history, past surgical history and problem list. Problem list updated.  Objective:   Vitals:   07/28/17 0926  BP: 134/80  Pulse: 84  Weight: 194 lb (88 kg)    Fetal Status: Fetal Heart Rate (bpm): 141 Fundal Height: 27 cm Movement: Present     General:  Alert, oriented and cooperative. Patient is in no acute distress.  Skin: Skin is warm and dry. No rash noted.   Cardiovascular: Normal heart rate noted  Respiratory: Normal respiratory effort, no problems with respiration noted  Abdomen: Soft, gravid, appropriate for gestational age.  Pain/Pressure: Present     Pelvic: Cervical exam deferred        Extremities: Normal range of motion.  Edema: None  Mental Status:  Normal mood and affect. Normal behavior. Normal judgment and thought content.   Assessment and Plan:  Pregnancy: [id number] at [redacted]w[redacted]d  1. Supervision of high risk pregnancy, antepartum Patient is doing well without complaints Patient is not fasting today but plans to return tomorrow for third trimester labs, glucola and tdap  2. ANA positive Neg [place] and [place] antibody Patient scheduled to see rheumatologist  Preterm labor symptoms and general obstetric precautions including but not limited to vaginal bleeding, contractions, leaking of fluid and  fetal movement were reviewed in detail with the patient. Please refer to After Visit Summary for other counseling recommendations.  Return in about 2 weeks (around 08/11/2017) for ROB, 2 hr glucola next visit.   Catalina Antigua, MD

## 2017-07-29 ENCOUNTER — Other Ambulatory Visit: Payer: Medicaid Other

## 2017-07-29 DIAGNOSIS — O0993 Supervision of high risk pregnancy, unspecified, third trimester: Secondary | ICD-10-CM

## 2017-07-30 LAB — CBC
HEMATOCRIT: 30.6 % — AB (ref 34.0–46.6)
HEMOGLOBIN: 9.6 g/dL — AB (ref 11.1–15.9)
MCH: 23.5 pg — AB (ref 26.6–33.0)
MCHC: 31.4 g/dL — AB (ref 31.5–35.7)
MCV: 75 fL — AB (ref 79–97)
Platelets: 262 10*3/uL (ref 150–379)
RBC: 4.08 x10E6/uL (ref 3.77–5.28)
RDW: 16.8 % — ABNORMAL HIGH (ref 12.3–15.4)
WBC: 5.5 10*3/uL (ref 3.4–10.8)

## 2017-07-30 LAB — GLUCOSE TOLERANCE, 2 HOURS W/ 1HR
Glucose, 1 hour: 98 mg/dL (ref 65–179)
Glucose, 2 hour: 60 mg/dL — ABNORMAL LOW (ref 65–152)
Glucose, Fasting: 71 mg/dL (ref 65–91)

## 2017-07-30 LAB — HIV ANTIBODY (ROUTINE TESTING W REFLEX): HIV Screen 4th Generation wRfx: NONREACTIVE

## 2017-07-30 LAB — RPR: RPR Ser Ql: NONREACTIVE

## 2017-08-14 ENCOUNTER — Ambulatory Visit (INDEPENDENT_AMBULATORY_CARE_PROVIDER_SITE_OTHER): Payer: Medicaid Other | Admitting: Obstetrics and Gynecology

## 2017-08-14 VITALS — BP 129/80 | HR 76 | Wt 201.8 lb

## 2017-08-14 DIAGNOSIS — R768 Other specified abnormal immunological findings in serum: Secondary | ICD-10-CM

## 2017-08-14 DIAGNOSIS — O0990 Supervision of high risk pregnancy, unspecified, unspecified trimester: Secondary | ICD-10-CM

## 2017-08-14 DIAGNOSIS — M797 Fibromyalgia: Secondary | ICD-10-CM

## 2017-08-14 DIAGNOSIS — O0993 Supervision of high risk pregnancy, unspecified, third trimester: Secondary | ICD-10-CM

## 2017-08-14 NOTE — Progress Notes (Signed)
Subjective:  Caitlin Hartman is a 26 y.o. [phone] at 572w0d being seen today for ongoing prenatal care.  She is currently monitored for the following issues for this high-risk pregnancy and has Supervision of high risk pregnancy, antepartum; Fibromyalgia; Rheumatoid aortitis; Asthma; Vitamin D deficiency; ANA positive; and Low vitamin D level on her problem list.  Patient reports general discomforts of pregnancy.  Contractions: Irritability. Vag. Bleeding: None.  Movement: Present. Denies leaking of fluid.   The following portions of the patient's history were reviewed and updated as appropriate: allergies, current medications, past family history, past medical history, past social history, past surgical history and problem list. Problem list updated.  Objective:   Vitals:   08/14/17 1028  BP: 129/80  Pulse: 76  Weight: 201 lb 12.8 oz (91.5 kg)    Fetal Status: Fetal Heart Rate (bpm): 134   Movement: Present     General:  Alert, oriented and cooperative. Patient is in no acute distress.  Skin: Skin is warm and dry. No rash noted.   Cardiovascular: Normal heart rate noted  Respiratory: Normal respiratory effort, no problems with respiration noted  Abdomen: Soft, gravid, appropriate for gestational age. Pain/Pressure: Present     Pelvic:  Cervical exam deferred        Extremities: Normal range of motion.  Edema: Trace  Mental Status: Normal mood and affect. Normal behavior. Normal judgment and thought content.   Urinalysis:      Assessment and Plan:  Pregnancy: [id number] at 732w0d  1. Supervision of high risk pregnancy, antepartum Stable Declined Flu vaccine  2. ANA positive Continue with BASA Appt with Rheumatology next month  3. Fibromyalgia See # 2  Preterm labor symptoms and general obstetric precautions including but not limited to vaginal bleeding, contractions, leaking of fluid and fetal movement were reviewed in detail with the patient. Please refer to After Visit Summary  for other counseling recommendations.  Return in about 2 weeks (around 08/28/2017) for OB visit.   Hermina StaggersErvin, Michael L, MD

## 2017-08-14 NOTE — Progress Notes (Signed)
Patient reports good fetal movement and complains of occasional uterine irritability with pelvic pressure.

## 2017-08-15 ENCOUNTER — Other Ambulatory Visit: Payer: Self-pay | Admitting: Certified Nurse Midwife

## 2017-08-15 DIAGNOSIS — N760 Acute vaginitis: Principal | ICD-10-CM

## 2017-08-15 DIAGNOSIS — B3731 Acute candidiasis of vulva and vagina: Secondary | ICD-10-CM

## 2017-08-15 DIAGNOSIS — B9689 Other specified bacterial agents as the cause of diseases classified elsewhere: Secondary | ICD-10-CM

## 2017-08-15 DIAGNOSIS — B373 Candidiasis of vulva and vagina: Secondary | ICD-10-CM

## 2017-08-28 ENCOUNTER — Ambulatory Visit (INDEPENDENT_AMBULATORY_CARE_PROVIDER_SITE_OTHER): Payer: Medicaid Other | Admitting: Family Medicine

## 2017-08-28 VITALS — BP 124/79 | HR 79 | Wt 202.0 lb

## 2017-08-28 DIAGNOSIS — R768 Other specified abnormal immunological findings in serum: Secondary | ICD-10-CM

## 2017-08-28 DIAGNOSIS — I011 Acute rheumatic endocarditis: Secondary | ICD-10-CM

## 2017-08-28 DIAGNOSIS — D508 Other iron deficiency anemias: Secondary | ICD-10-CM

## 2017-08-28 DIAGNOSIS — D649 Anemia, unspecified: Secondary | ICD-10-CM | POA: Insufficient documentation

## 2017-08-28 DIAGNOSIS — O0993 Supervision of high risk pregnancy, unspecified, third trimester: Secondary | ICD-10-CM

## 2017-08-28 DIAGNOSIS — O0990 Supervision of high risk pregnancy, unspecified, unspecified trimester: Secondary | ICD-10-CM

## 2017-08-28 MED ORDER — FERROUS SULFATE 325 (65 FE) MG PO TABS: 325.0000 mg | ORAL_TABLET | Freq: Two times a day (BID) | ORAL | 1 refills | Status: DC

## 2017-08-28 NOTE — Progress Notes (Signed)
   PRENATAL VISIT NOTE  Subjective:  Caitlin Hartman is a 26 y.o. [phone] at [redacted]w[redacted]d being seen today for ongoing prenatal care.  She is currently monitored for the following issues for this high-risk pregnancy and has Supervision of high risk pregnancy, antepartum; Fibromyalgia; Rheumatoid aortitis; Asthma; Vitamin D deficiency; ANA positive; Low vitamin D level; and Anemia on her problem list.  Patient reports no complaints.  Contractions: Irregular. Vag. Bleeding: None.  Movement: Present. Denies leaking of fluid.   The following portions of the patient's history were reviewed and updated as appropriate: allergies, current medications, past family history, past medical history, past social history, past surgical history and problem list. Problem list updated.  Objective:   Vitals:   08/28/17 1021  BP: 124/79  Pulse: 79  Weight: 202 lb (91.6 kg)    Fetal Status: Fetal Heart Rate (bpm): 145 Fundal Height: 31 cm Movement: Present     General:  Alert, oriented and cooperative. Patient is in no acute distress.  Skin: Skin is warm and dry. No rash noted.   Cardiovascular: Normal heart rate noted  Respiratory: Normal respiratory effort, no problems with respiration noted  Abdomen: Soft, gravid, appropriate for gestational age.  Pain/Pressure: Absent     Pelvic: Cervical exam deferred        Extremities: Normal range of motion.  Edema: None  Mental Status:  Normal mood and affect. Normal behavior. Normal judgment and thought content.   Assessment and Plan:  Pregnancy: [id number] at [redacted]w[redacted]d  1. Supervision of high risk pregnancy, antepartum FHT and FH normal  2. Other iron deficiency anemia Hg 9.6. Pt states she has thalassemia trait, but Hg electrophoresis normal. Baseline Hg 11. Start iron  3. 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  4. ANA positive   Preterm labor symptoms and general obstetric precautions including but not limited to vaginal bleeding, contractions, leaking  of fluid and fetal movement were reviewed in detail with the patient. Please refer to After Visit Summary for other counseling recommendations.  No Follow-up on file.   Levie Heritage, DO

## 2017-09-11 ENCOUNTER — Ambulatory Visit (INDEPENDENT_AMBULATORY_CARE_PROVIDER_SITE_OTHER): Payer: Medicaid Other | Admitting: Obstetrics and Gynecology

## 2017-09-11 VITALS — BP 120/77 | HR 88 | Wt 201.0 lb

## 2017-09-11 DIAGNOSIS — D508 Other iron deficiency anemias: Secondary | ICD-10-CM

## 2017-09-11 DIAGNOSIS — I011 Acute rheumatic endocarditis: Secondary | ICD-10-CM

## 2017-09-11 DIAGNOSIS — R768 Other specified abnormal immunological findings in serum: Secondary | ICD-10-CM

## 2017-09-11 DIAGNOSIS — O99013 Anemia complicating pregnancy, third trimester: Secondary | ICD-10-CM

## 2017-09-11 DIAGNOSIS — Z23 Encounter for immunization: Secondary | ICD-10-CM | POA: Diagnosis not present

## 2017-09-11 DIAGNOSIS — O0993 Supervision of high risk pregnancy, unspecified, third trimester: Secondary | ICD-10-CM

## 2017-09-11 DIAGNOSIS — E559 Vitamin D deficiency, unspecified: Secondary | ICD-10-CM

## 2017-09-11 DIAGNOSIS — O0990 Supervision of high risk pregnancy, unspecified, unspecified trimester: Secondary | ICD-10-CM

## 2017-09-11 DIAGNOSIS — D509 Iron deficiency anemia, unspecified: Secondary | ICD-10-CM | POA: Insufficient documentation

## 2017-09-11 NOTE — Progress Notes (Signed)
   PRENATAL VISIT NOTE  Subjective:  Vidhi Delellis is a 26 y.o. [phone] at [redacted]w[redacted]d being seen today for ongoing prenatal care.  She is currently monitored for the following issues for this high-risk pregnancy and has Supervision of high risk pregnancy, antepartum; Fibromyalgia; Rheumatoid aortitis; Asthma; Vitamin D deficiency; ANA positive; Low vitamin D level; Anemia; and Iron deficiency anemia on her problem list.  Patient reports fatigue and occasional contractions.  Contractions: Irregular. Vag. Bleeding: None.  Movement: Present. Denies leaking of fluid.   The following portions of the patient's history were reviewed and updated as appropriate: allergies, current medications, past family history, past medical history, past social history, past surgical history and problem list. Problem list updated.  Objective:   Vitals:   09/11/17 1022  BP: 120/77  Pulse: 88  Weight: 201 lb (91.2 kg)    Fetal Status: Fetal Heart Rate (bpm): 148   Movement: Present     General:  Alert, oriented and cooperative. Patient is in no acute distress.  Skin: Skin is warm and dry. No rash noted.   Cardiovascular: Normal heart rate noted  Respiratory: Normal respiratory effort, no problems with respiration noted  Abdomen: Soft, gravid, appropriate for gestational age.  Pain/Pressure: Absent     Pelvic: Cervical exam deferred        Extremities: Normal range of motion.  Edema: None  Mental Status:  Normal mood and affect. Normal behavior. Normal judgment and thought content.   Assessment and Plan:  Pregnancy: [id number] at [redacted]w[redacted]d  1. Supervision of high risk pregnancy, antepartum Doing well - flu and tdap today  2. Other iron deficiency anemia On iron - taking  3. ANA positive - rheumatology appt 10/19  4. Rheumatoid aortitis - see above  Preterm labor symptoms and general obstetric precautions including but not limited to vaginal bleeding, contractions, leaking of fluid and fetal movement were  reviewed in detail with the patient. Please refer to After Visit Summary for other counseling recommendations.  Return in about 2 weeks (around 09/25/2017) for OB visit (MD).   Conan Bowens, MD

## 2017-09-11 NOTE — Patient Instructions (Addendum)
Third Trimester of Pregnancy The third trimester is from week 28 through week 40 (months 7 through 9). The third trimester is a time when the unborn baby (fetus) is growing rapidly. At the end of the ninth month, the fetus is about 20 inches in length and weighs 6-10 pounds. Body changes during your third trimester Your body will continue to go through many changes during pregnancy. The changes vary from woman to woman. During the third trimester:  Your weight will continue to increase. You can expect to gain 25-35 pounds (11-16 kg) by the end of the pregnancy.  You may begin to get stretch marks on your hips, abdomen, and breasts.  You may urinate more often because the fetus is moving lower into your pelvis and pressing on your bladder.  You may develop or continue to have heartburn. This is caused by increased hormones that slow down muscles in the digestive tract.  You may develop or continue to have constipation because increased hormones slow digestion and cause the muscles that push waste through your intestines to relax.  You may develop hemorrhoids. These are swollen veins (varicose veins) in the rectum that can itch or be painful.  You may develop swollen, bulging veins (varicose veins) in your legs.  You may have increased body aches in the pelvis, back, or thighs. This is due to weight gain and increased hormones that are relaxing your joints.  You may have changes in your hair. These can include thickening of your hair, rapid growth, and changes in texture. Some women also have hair loss during or after pregnancy, or hair that feels dry or thin. Your hair will most likely return to normal after your baby is born.  Your breasts will continue to grow and they will continue to become tender. A yellow fluid (colostrum) may leak from your breasts. This is the first milk you are producing for your baby.  Your belly button may stick out.  You may notice more swelling in your hands,  face, or ankles.  You may have increased tingling or numbness in your hands, arms, and legs. The skin on your belly may also feel numb.  You may feel short of breath because of your expanding uterus.  You may have more problems sleeping. This can be caused by the size of your belly, increased need to urinate, and an increase in your body's metabolism.  You may notice the fetus "dropping," or moving lower in your abdomen (lightening).  You may have increased vaginal discharge.  You may notice your joints feel loose and you may have pain around your pelvic bone.  What to expect at prenatal visits You will have prenatal exams every 2 weeks until week 36. Then you will have weekly prenatal exams. During a routine prenatal visit:  You will be weighed to make sure you and the baby are growing normally.  Your blood pressure will be taken.  Your abdomen will be measured to track your baby's growth.  The fetal heartbeat will be listened to.  Any test results from the previous visit will be discussed.  You may have a cervical check near your due date to see if your cervix has softened or thinned (effaced).  You will be tested for Group B streptococcus. This happens between 35 and 37 weeks.  Your health care provider may ask you:  What your birth plan is.  How you are feeling.  If you are feeling the baby move.  If you have had   any abnormal symptoms, such as leaking fluid, bleeding, severe headaches, or abdominal cramping.  If you are using any tobacco products, including cigarettes, chewing tobacco, and electronic cigarettes.  If you have any questions.  Other tests or screenings that may be performed during your third trimester include:  Blood tests that check for low iron levels (anemia).  Fetal testing to check the health, activity level, and growth of the fetus. Testing is done if you have certain medical conditions or if there are problems during the  pregnancy.  Nonstress test (NST). This test checks the health of your baby to make sure there are no signs of problems, such as the baby not getting enough oxygen. During this test, a belt is placed around your belly. The baby is made to move, and its heart rate is monitored during movement.  What is false labor? False labor is a condition in which you feel small, irregular tightenings of the muscles in the womb (contractions) that usually go away with rest, changing position, or drinking water. These are called Braxton Hicks contractions. Contractions may last for hours, days, or even weeks before true labor sets in. If contractions come at regular intervals, become more frequent, increase in intensity, or become painful, you should see your health care provider. What are the signs of labor?  Abdominal cramps.  Regular contractions that start at 10 minutes apart and become stronger and more frequent with time.  Contractions that start on the top of the uterus and spread down to the lower abdomen and back.  Increased pelvic pressure and dull back pain.  A watery or bloody mucus discharge that comes from the vagina.  Leaking of amniotic fluid. This is also known as your "water breaking." It could be a slow trickle or a gush. Let your health care provider know if it has a color or strange odor. If you have any of these signs, call your health care provider right away, even if it is before your due date. Follow these instructions at home: Medicines  Follow your health care provider's instructions regarding medicine use. Specific medicines may be either safe or unsafe to take during pregnancy.  Take a prenatal vitamin that contains at least 600 micrograms (mcg) of folic acid.  If you develop constipation, try taking a stool softener if your health care provider approves. Eating and drinking  Eat a balanced diet that includes fresh fruits and vegetables, whole grains, good sources of protein  such as meat, eggs, or tofu, and low-fat dairy. Your health care provider will help you determine the amount of weight gain that is right for you.  Avoid raw meat and uncooked cheese. These carry germs that can cause birth defects in the baby.  If you have low calcium intake from food, talk to your health care provider about whether you should take a daily calcium supplement.  Eat four or five small meals rather than three large meals a day.  Limit foods that are high in fat and processed sugars, such as fried and sweet foods.  To prevent constipation: ? Drink enough fluid to keep your urine clear or pale yellow. ? Eat foods that are high in fiber, such as fresh fruits and vegetables, whole grains, and beans. Activity  Exercise only as directed by your health care provider. Most women can continue their usual exercise routine during pregnancy. Try to exercise for 30 minutes at least 5 days a week. Stop exercising if you experience uterine contractions.  Avoid heavy   lifting.  Do not exercise in extreme heat or humidity, or at high altitudes.  Wear low-heel, comfortable shoes.  Practice good posture.  You may continue to have sex unless your health care provider tells you otherwise. Relieving pain and discomfort  Take frequent breaks and rest with your legs elevated if you have leg cramps or low back pain.  Take warm sitz baths to soothe any pain or discomfort caused by hemorrhoids. Use hemorrhoid cream if your health care provider approves.  Wear a good support bra to prevent discomfort from breast tenderness.  If you develop varicose veins: ? Wear support pantyhose or compression stockings as told by your healthcare provider. ? Elevate your feet for 15 minutes, 3-4 times a day. Prenatal care  Write down your questions. Take them to your prenatal visits.  Keep all your prenatal visits as told by your health care provider. This is important. Safety  Wear your seat belt at  all times when driving.  Make a list of emergency phone numbers, including numbers for family, friends, the hospital, and police and fire departments. General instructions  Avoid cat litter boxes and soil used by cats. These carry germs that can cause birth defects in the baby. If you have a cat, ask someone to clean the litter box for you.  Do not travel far distances unless it is absolutely necessary and only with the approval of your health care provider.  Do not use hot tubs, steam rooms, or saunas.  Do not drink alcohol.  Do not use any products that contain nicotine or tobacco, such as cigarettes and e-cigarettes. If you need help quitting, ask your health care provider.  Do not use any medicinal herbs or unprescribed drugs. These chemicals affect the formation and growth of the baby.  Do not douche or use tampons or scented sanitary pads.  Do not cross your legs for long periods of time.  To prepare for the arrival of your baby: ? Take prenatal classes to understand, practice, and ask questions about labor and delivery. ? Make a trial run to the hospital. ? Visit the hospital and tour the maternity area. ? Arrange for maternity or paternity leave through employers. ? Arrange for family and friends to take care of pets while you are in the hospital. ? Purchase a rear-facing car seat and make sure you know how to install it in your car. ? Pack your hospital bag. ? Prepare the baby's nursery. Make sure to remove all pillows and stuffed animals from the baby's crib to prevent suffocation.  Visit your dentist if you have not gone during your pregnancy. Use a soft toothbrush to brush your teeth and be gentle when you floss. Contact a health care provider if:  You are unsure if you are in labor or if your water has broken.  You become dizzy.  You have mild pelvic cramps, pelvic pressure, or nagging pain in your abdominal area.  You have lower back pain.  You have persistent  nausea, vomiting, or diarrhea.  You have an unusual or bad smelling vaginal discharge.  You have pain when you urinate. Get help right away if:  Your water breaks before 37 weeks.  You have regular contractions less than 5 minutes apart before 37 weeks.  You have a fever.  You are leaking fluid from your vagina.  You have spotting or bleeding from your vagina.  You have severe abdominal pain or cramping.  You have rapid weight loss or weight gain.    You have shortness of breath with chest pain.  You notice sudden or extreme swelling of your face, hands, ankles, feet, or legs.  Your baby makes fewer than 10 movements in 2 hours.  You have severe headaches that do not go away when you take medicine.  You have vision changes. Summary  The third trimester is from week 28 through week 40, months 7 through 9. The third trimester is a time when the unborn baby (fetus) is growing rapidly.  During the third trimester, your discomfort may increase as you and your baby continue to gain weight. You may have abdominal, leg, and back pain, sleeping problems, and an increased need to urinate.  During the third trimester your breasts will keep growing and they will continue to become tender. A yellow fluid (colostrum) may leak from your breasts. This is the first milk you are producing for your baby.  False labor is a condition in which you feel small, irregular tightenings of the muscles in the womb (contractions) that eventually go away. These are called Braxton Hicks contractions. Contractions may last for hours, days, or even weeks before true labor sets in.  Signs of labor can include: abdominal cramps; regular contractions that start at 10 minutes apart and become stronger and more frequent with time; watery or bloody mucus discharge that comes from the vagina; increased pelvic pressure and dull back pain; and leaking of amniotic fluid. This information is not intended to replace advice  given to you by your health care provider. Make sure you discuss any questions you have with your health care provider. Document Released: 11/19/2001 Document Revised: 05/02/2016 Document Reviewed: 01/26/2013 Elsevier Interactive Patient Education  2017 [employer]  Intrauterine Device Information An intrauterine device (IUD) is inserted into your uterus to prevent pregnancy. There are two types of IUDs available:  Copper IUD-This type of IUD is wrapped in copper wire and is placed inside the uterus. Copper makes the uterus and fallopian tubes produce a fluid that kills sperm. The copper IUD can stay in place for 10 years.  Hormone IUD-This type of IUD contains the hormone progestin (synthetic progesterone). The hormone thickens the cervical mucus and prevents sperm from entering the uterus. It also thins the uterine lining to prevent implantation of a fertilized egg. The hormone can weaken or kill the sperm that get into the uterus. One type of hormone IUD can stay in place for 5 years, and another type can stay in place for 3 years.  Your health care provider will make sure you are a good candidate for a contraceptive IUD. Discuss with your health care provider the possible side effects. Advantages of an intrauterine device  IUDs are highly effective, reversible, long acting, and low maintenance.  There are no estrogen-related side effects.  An IUD can be used when breastfeeding.  IUDs are not associated with weight gain.  The copper IUD works immediately after insertion.  The hormone IUD works right away if inserted within 7 days of your period starting. You will need to use a backup method of birth control for 7 days if the hormone IUD is inserted at any other time in your cycle.  The copper IUD does not interfere with your female hormones.  The hormone IUD can make heavy menstrual periods lighter and decrease cramping.  The hormone IUD can be used for 3 or 5 years.  The copper  IUD can be used for 10 years. Disadvantages of an intrauterine device  The hormone IUD  can be associated with irregular bleeding patterns.  The copper IUD can make your menstrual flow heavier and more painful.  You may experience cramping and vaginal bleeding after insertion. This information is not intended to replace advice given to you by your health care provider. Make sure you discuss any questions you have with your health care provider. Document Released: 10/29/2004 Document Revised: 05/02/2016 Document Reviewed: 05/16/2013 Elsevier Interactive Patient Education  2017 [employer]. Etonogestrel implant What is this medicine? ETONOGESTREL (et oh noe JES trel) is a contraceptive (birth control) device. It is used to prevent pregnancy. It can be used for up to 3 years. This medicine may be used for other purposes; ask your health care provider or pharmacist if you have questions. COMMON BRAND NAME(S): Implanon, Nexplanon What should I tell my health care provider before I take this medicine? They need to know if you have any of these conditions: -abnormal vaginal bleeding -blood vessel disease or blood clots -cancer of the breast, cervix, or liver -depression -diabetes -gallbladder disease -headaches -heart disease or recent heart attack -high blood pressure -high cholesterol -kidney disease -liver disease -renal disease -seizures -tobacco smoker -an unusual or allergic reaction to etonogestrel, other hormones, anesthetics or antiseptics, medicines, foods, dyes, or preservatives -pregnant or trying to get pregnant -breast-feeding How should I use this medicine? This device is inserted just under the skin on the inner side of your upper arm by a health care professional. Talk to your pediatrician regarding the use of this medicine in children. Special care may be needed. Overdosage: If you think you have taken too much of this medicine contact a poison control center or  emergency room at once. NOTE: This medicine is only for you. Do not share this medicine with others. What if I miss a dose? This does not apply. What may interact with this medicine? Do not take this medicine with any of the following medications: -amprenavir -bosentan -fosamprenavir This medicine may also interact with the following medications: -barbiturate medicines for inducing sleep or treating seizures -certain medicines for fungal infections like ketoconazole and itraconazole -grapefruit juice -griseofulvin -medicines to treat seizures like carbamazepine, felbamate, oxcarbazepine, phenytoin, topiramate -modafinil -phenylbutazone -rifampin -rufinamide -some medicines to treat HIV infection like atazanavir, indinavir, lopinavir, nelfinavir, tipranavir, ritonavir -St. John's wort This list may not describe all possible interactions. Give your health care provider a list of all the medicines, herbs, non-prescription drugs, or dietary supplements you use. Also tell them if you smoke, drink alcohol, or use illegal drugs. Some items may interact with your medicine. What should I watch for while using this medicine? This product does not protect you against HIV infection (AIDS) or other sexually transmitted diseases. You should be able to feel the implant by pressing your fingertips over the skin where it was inserted. Contact your doctor if you cannot feel the implant, and use a non-hormonal birth control method (such as condoms) until your doctor confirms that the implant is in place. If you feel that the implant may have broken or become bent while in your arm, contact your healthcare provider. What side effects may I notice from receiving this medicine? Side effects that you should report to your doctor or health care professional as soon as possible: -allergic reactions like skin rash, itching or hives, swelling of the face, lips, or tongue -breast lumps -changes in emotions or  moods -depressed mood -heavy or prolonged menstrual bleeding -pain, irritation, swelling, or bruising at the insertion site -scar at site of  insertion -signs of infection at the insertion site such as fever, and skin redness, pain or discharge -signs of pregnancy -signs and symptoms of a blood clot such as breathing problems; changes in vision; chest pain; severe, sudden headache; pain, swelling, warmth in the leg; trouble speaking; sudden numbness or weakness of the face, arm or leg -signs and symptoms of liver injury like dark yellow or brown urine; general ill feeling or flu-like symptoms; light-colored stools; loss of appetite; nausea; right upper belly pain; unusually weak or tired; yellowing of the eyes or skin -unusual vaginal bleeding, discharge -signs and symptoms of a stroke like changes in vision; confusion; trouble speaking or understanding; severe headaches; sudden numbness or weakness of the face, arm or leg; trouble walking; dizziness; loss of balance or coordination Side effects that usually do not require medical attention (report to your doctor or health care professional if they continue or are bothersome): -acne -back pain -breast pain -changes in weight -dizziness -general ill feeling or flu-like symptoms -headache -irregular menstrual bleeding -nausea -sore throat -vaginal irritation or inflammation This list may not describe all possible side effects. Call your doctor for medical advice about side effects. You may report side effects to FDA at 1-800-FDA-1088. Where should I keep my medicine? This drug is given in a hospital or clinic and will not be stored at home. NOTE: This sheet is a summary. It may not cover all possible information. If you have questions about this medicine, talk to your doctor, pharmacist, or health care provider.  2018 Elsevier/Gold Standard (2016-06-13 11:19:22)

## 2017-09-25 ENCOUNTER — Ambulatory Visit (INDEPENDENT_AMBULATORY_CARE_PROVIDER_SITE_OTHER): Payer: Medicaid Other | Admitting: Obstetrics & Gynecology

## 2017-09-25 VITALS — BP 113/75 | HR 84 | Wt 206.0 lb

## 2017-09-25 DIAGNOSIS — O0990 Supervision of high risk pregnancy, unspecified, unspecified trimester: Secondary | ICD-10-CM

## 2017-09-25 DIAGNOSIS — O0993 Supervision of high risk pregnancy, unspecified, third trimester: Secondary | ICD-10-CM

## 2017-09-25 NOTE — Patient Instructions (Signed)
Return to clinic for any scheduled appointments or obstetric concerns, or go to [facility] for evaluation  

## 2017-09-25 NOTE — Progress Notes (Signed)
   PRENATAL VISIT NOTE  Subjective:  Caitlin Hartman is a 26 y.o. [phone] at 8124w0d being seen today for ongoing prenatal care.  She is currently monitored for the following issues for this high-risk pregnancy and has Supervision of high risk pregnancy, antepartum; Fibromyalgia; Rheumatoid aortitis; Asthma; Vitamin D deficiency; ANA positive; Low vitamin D level; Anemia; and Iron deficiency anemia on her problem list.  Patient reports no complaints.  Contractions: Irregular. Vag. Bleeding: None.  Movement: Present. Denies leaking of fluid.   The following portions of the patient's history were reviewed and updated as appropriate: allergies, current medications, past family history, past medical history, past social history, past surgical history and problem list. Problem list updated.  Objective:   Vitals:   09/25/17 0946  BP: 113/75  Pulse: 84  Weight: 206 lb (93.4 kg)    Fetal Status: Fetal Heart Rate (bpm): 145 Fundal Height: 35 cm Movement: Present     General:  Alert, oriented and cooperative. Patient is in no acute distress.  Skin: Skin is warm and dry. No rash noted.   Cardiovascular: Normal heart rate noted  Respiratory: Normal respiratory effort, no problems with respiration noted  Abdomen: Soft, gravid, appropriate for gestational age.  Pain/Pressure: Present     Pelvic: Cervical exam deferred        Extremities: Normal range of motion.  Edema: None  Mental Status:  Normal mood and affect. Normal behavior. Normal judgment and thought content.   Assessment and Plan:  Pregnancy: [id number] at 7724w0d  1. Supervision of high risk pregnancy, antepartum No complaints. Preterm labor symptoms and general obstetric precautions including but not limited to vaginal bleeding, contractions, leaking of fluid and fetal movement were reviewed in detail with the patient. Please refer to After Visit Summary for other counseling recommendations.  Return in about 1 week (around 10/02/2017) for OB  Visit, Pelvic cultures.   Jaynie CollinsUgonna Kawthar Ennen, MD

## 2017-09-26 DIAGNOSIS — R768 Other specified abnormal immunological findings in serum: Secondary | ICD-10-CM | POA: Diagnosis not present

## 2017-09-26 DIAGNOSIS — M2550 Pain in unspecified joint: Secondary | ICD-10-CM | POA: Diagnosis not present

## 2017-09-30 ENCOUNTER — Other Ambulatory Visit (HOSPITAL_COMMUNITY)
Admission: RE | Admit: 2017-09-30 | Discharge: 2017-09-30 | Disposition: A | Discharge status: Home / Self Care | Payer: Medicaid Other | Source: Ambulatory Visit | Attending: Certified Nurse Midwife | Admitting: Certified Nurse Midwife

## 2017-09-30 ENCOUNTER — Ambulatory Visit (INDEPENDENT_AMBULATORY_CARE_PROVIDER_SITE_OTHER): Payer: Medicaid Other | Admitting: Certified Nurse Midwife

## 2017-09-30 VITALS — BP 117/79 | HR 75 | Wt 209.0 lb

## 2017-09-30 DIAGNOSIS — Z3A35 35 weeks gestation of pregnancy: Secondary | ICD-10-CM | POA: Insufficient documentation

## 2017-09-30 DIAGNOSIS — O0990 Supervision of high risk pregnancy, unspecified, unspecified trimester: Secondary | ICD-10-CM | POA: Diagnosis present

## 2017-09-30 DIAGNOSIS — R7989 Other specified abnormal findings of blood chemistry: Secondary | ICD-10-CM

## 2017-09-30 DIAGNOSIS — D508 Other iron deficiency anemias: Secondary | ICD-10-CM

## 2017-09-30 DIAGNOSIS — O26893 Other specified pregnancy related conditions, third trimester: Secondary | ICD-10-CM

## 2017-09-30 DIAGNOSIS — J4520 Mild intermittent asthma, uncomplicated: Secondary | ICD-10-CM

## 2017-09-30 DIAGNOSIS — E559 Vitamin D deficiency, unspecified: Secondary | ICD-10-CM

## 2017-09-30 DIAGNOSIS — O0993 Supervision of high risk pregnancy, unspecified, third trimester: Secondary | ICD-10-CM

## 2017-09-30 DIAGNOSIS — R102 Pelvic and perineal pain: Secondary | ICD-10-CM

## 2017-09-30 DIAGNOSIS — M797 Fibromyalgia: Secondary | ICD-10-CM

## 2017-09-30 MED ORDER — CITRANATAL BLOOM 90-1 MG PO TABS
1.0000 | ORAL_TABLET | Freq: Every day | ORAL | 12 refills | Status: DC
Start: 2017-09-30 — End: 2017-09-30

## 2017-09-30 MED ORDER — COMFORT FIT MATERNITY SUPP LG MISC: 1.0000 [IU] | Freq: Every day | 0 refills | Status: DC

## 2017-09-30 MED ORDER — CITRANATAL BLOOM 90-1 MG PO TABS: 1.0000 | ORAL_TABLET | Freq: Every day | ORAL | 12 refills | Status: DC

## 2017-09-30 NOTE — Progress Notes (Signed)
   PRENATAL VISIT NOTE  Subjective:  Beatriz Chancellorzyzah Shuler is a 26 y.o. [id number] at 3197w5d being seen today for ongoing prenatal care.  She is currently monitored for the following issues for this high-risk pregnancy and has Supervision of high risk pregnancy, antepartum; Fibromyalgia; Rheumatoid aortitis; Asthma; Vitamin D deficiency; ANA positive; Low vitamin D level; Anemia; and Iron deficiency anemia on her problem list.  Patient reports no bleeding, no contractions, no cramping, no leaking and pelvic pressure, no contractions.  Contractions: Not present. Vag. Bleeding: None.  Movement: Present. Denies leaking of fluid.   The following portions of the patient's history were reviewed and updated as appropriate: allergies, current medications, past family history, past medical history, past social history, past surgical history and problem list. Problem list updated.  Objective:   Vitals:   09/30/17 1035  BP: 117/79  Pulse: 75  Weight: 209 lb (94.8 kg)    Fetal Status: Fetal Heart Rate (bpm): 142; doppler Fundal Height: 36 cm Movement: Present  Presentation: Vertex  General:  Alert, oriented and cooperative. Patient is in no acute distress.  Skin: Skin is warm and dry. No rash noted.   Cardiovascular: Normal heart rate noted  Respiratory: Normal respiratory effort, no problems with respiration noted  Abdomen: Soft, gravid, appropriate for gestational age.  Pain/Pressure: Absent     Pelvic: Cervical exam deferred Dilation: Closed Effacement (%): Thick Station: -3  Extremities: Normal range of motion.     Mental Status:  Normal mood and affect. Normal behavior. Normal judgment and thought content.   Assessment and Plan:  Pregnancy: [id number] at 6897w5d  1. Supervision of high risk pregnancy, antepartum      - Strep Gp B NAA - Cervicovaginal ancillary only  2. Vitamin D deficiency     Taking weekly vitamin D  3. Low vitamin D level       4. Other iron deficiency anemia      -  Prenatal-DSS-FeCb-FeGl-FA (CITRANATAL BLOOM) 90-1 MG TABS; Take 1 tablet by mouth daily.  Dispense: 30 tablet; Refill: 12  5. Mild intermittent asthma without complication     Stable  6. Fibromyalgia      Saw rheumatology 09/17/17.  Need records  7. Pelvic pain affecting pregnancy in third trimester, antepartum    - Elastic Bandages & Supports (COMFORT FIT MATERNITY SUPP LG) MISC; 1 Units by Does not apply route daily.  Dispense: 1 each; Refill: 0  Preterm labor symptoms and general obstetric precautions including but not limited to vaginal bleeding, contractions, leaking of fluid and fetal movement were reviewed in detail with the patient. Please refer to After Visit Summary for other counseling recommendations.  Return in about 1 week (around 10/07/2017) for ROB.   Roe Coombsachelle A Nailyn Dearinger, CNM

## 2017-09-30 NOTE — Patient Instructions (Addendum)
Group B Streptococcus Infection During Pregnancy Group B Streptococcus (GBS) is a type of bacteria (Streptococcus agalactiae) that is often found in healthy people, commonly in the rectum, vagina, and intestines. In people who are healthy and not pregnant, the bacteria rarely cause serious illness or complications. However, women who test positive for GBS during pregnancy can pass the bacteria to their baby during childbirth, which can cause serious infection in the baby after birth. Women with GBS may also have infections during their pregnancy or immediately after childbirth, such as such as urinary tract infections (UTIs) or infections of the uterus (uterine infections). Having GBS also increases a woman's risk of complications during pregnancy, such as early (preterm) labor or delivery, miscarriage, or stillbirth. Routine testing (screening) for GBS is recommended for all pregnant women. What increases the risk? You may have a higher risk for GBS infection during pregnancy if you had one during a past pregnancy. What are the signs or symptoms? In most cases, GBS infection does not cause symptoms in pregnant women. Signs and symptoms of a possible GBS-related infection may include:  Labor starting before the 37th week of pregnancy.  A UTI or bladder infection, which may cause: ? Fever. ? Pain or burning during urination. ? Frequent urination.  Fever during labor, along with: ? Bad-smelling discharge. ? Uterine tenderness. ? Rapid heartbeat in the mother, baby, or both.  Rare but serious symptoms of a possible GBS-related infection in women include:  Blood infection (septicemia). This may cause fever, chills, or confusion.  Lung infection (pneumonia). This may cause fever, chills, cough, rapid breathing, difficulty breathing, or chest pain.  Bone, joint, skin, or soft tissue infection.  How is this diagnosed? You may be screened for GBS between week 35 and week 37 of your pregnancy. If  you have symptoms of preterm labor, you may be screened earlier. This condition is diagnosed based on lab test results from:  A swab of fluid from the vagina and rectum.  A urine sample.  How is this treated? This condition is treated with antibiotic medicine. When you go into labor, or as soon as your water breaks (your membranes rupture), you will be given antibiotics through an IV tube. Antibiotics will continue until after you give birth. If you are having a cesarean delivery, you do not need antibiotics unless your membranes have already ruptured. Follow these instructions at home:  Take over-the-counter and prescription medicines only as told by your health care provider.  Take your antibiotic medicine as told by your health care provider. Do not stop taking the antibiotic even if you start to feel better.  Keep all pre-birth (prenatal) visits and follow-up visits as told by your health care provider. This is important. Contact a health care provider if:  You have pain or burning when you urinate.  You have to urinate frequently.  You have a fever or chills.  You develop a bad-smelling vaginal discharge. Get help right away if:  Your membranes rupture.  You go into labor.  You have severe pain in your abdomen.  You have difficulty breathing.  You have chest pain. This information is not intended to replace advice given to you by your health care provider. Make sure you discuss any questions you have with your health care provider. Document Released: 03/03/2008 Document Revised: 06/21/2016 Document Reviewed: 06/20/2016 Elsevier Interactive Patient Education  2018 [employer]  

## 2017-09-30 NOTE — Addendum Note (Signed)
Addended by: Catalina AntiguaONSTANT, Spenser Harren on: 09/30/2017 11:05 AM   Modules accepted: Orders

## 2017-10-01 ENCOUNTER — Inpatient Hospital Stay (HOSPITAL_COMMUNITY)
Admission: AD | Admit: 2017-10-01 | Discharge: 2017-10-02 | Disposition: A | Discharge status: Home / Self Care | Payer: Medicaid Other | Source: Ambulatory Visit | Attending: Obstetrics and Gynecology | Admitting: Obstetrics and Gynecology

## 2017-10-01 ENCOUNTER — Encounter (HOSPITAL_COMMUNITY): Payer: Self-pay

## 2017-10-01 DIAGNOSIS — O26893 Other specified pregnancy related conditions, third trimester: Secondary | ICD-10-CM | POA: Diagnosis present

## 2017-10-01 DIAGNOSIS — Z3A35 35 weeks gestation of pregnancy: Secondary | ICD-10-CM | POA: Insufficient documentation

## 2017-10-01 DIAGNOSIS — O4703 False labor before 37 completed weeks of gestation, third trimester: Secondary | ICD-10-CM | POA: Diagnosis not present

## 2017-10-01 DIAGNOSIS — R1030 Lower abdominal pain, unspecified: Secondary | ICD-10-CM | POA: Diagnosis present

## 2017-10-01 LAB — URINALYSIS, ROUTINE W REFLEX MICROSCOPIC
Bilirubin Urine: NEGATIVE
GLUCOSE, UA: NEGATIVE mg/dL
HGB URINE DIPSTICK: NEGATIVE
Ketones, ur: NEGATIVE mg/dL
LEUKOCYTES UA: NEGATIVE
Nitrite: NEGATIVE
PH: 6 (ref 5.0–8.0)
PROTEIN: NEGATIVE mg/dL
SPECIFIC GRAVITY, URINE: 1.006 (ref 1.005–1.030)

## 2017-10-01 LAB — CERVICOVAGINAL ANCILLARY ONLY
Bacterial vaginitis: NEGATIVE
CHLAMYDIA, DNA PROBE: NEGATIVE
Candida vaginitis: NEGATIVE
NEISSERIA GONORRHEA: NEGATIVE
Trichomonas: NEGATIVE

## 2017-10-01 NOTE — Discharge Instructions (Signed)

## 2017-10-01 NOTE — MAU Provider Note (Signed)
History     CSN: [id number]  Arrival date and time: 10/01/17 2121   None     Chief Complaint  Patient presents with  . Abdominal Pain   26 yo [id number] at 2362w6d gestation presents with lower abdominal discomfort that started a few hours ago. Pain comes and goes. Nothing makes better or worse but pain has gradually worsened with any activity. Thinks she is having contractions. Denies vaginal bleeding or decreased fetal movement or loss of fluid.     OB History    Gravida Para Term Preterm AB Living   7 3 3  0 3 3   SAB TAB Ectopic Multiple Live Births                  Past Medical History:  Diagnosis Date  . Anemia   . Asthma   . Dyspnea   . Fibromyalgia   . Headache   . Hypertension   . Lyme disease 2013  . Mental disorder   . 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 Vaginal Pap smear, abnormal     Past Surgical History:  Procedure Laterality Date  . INDUCED ABORTION     x 3   . TONSILLECTOMY    . WISDOM TOOTH EXTRACTION      Family History  Problem Relation Age of Onset  . Alcohol abuse Mother   . Drug abuse Mother   . Depression Mother   . Mental illness Mother   . Drug abuse Father   . Cancer Maternal Aunt   . Diabetes Maternal Uncle   . Alcohol abuse Maternal Grandmother   . Cancer Maternal Grandmother   . Diabetes Maternal Grandmother   . Varicose Veins Maternal Grandmother   . Drug abuse Maternal Grandfather   . Cancer Paternal Grandmother     Social History  Substance Use Topics  . Smoking status: Never Smoker  . Smokeless tobacco: Never Used  . Alcohol use No    Allergies:  Allergies  Allergen Reactions  . Latex Hives  . Oxycodone-Acetaminophen Nausea And Vomiting    Prescriptions Prior to Admission  Medication Sig Dispense Refill Last Dose  . albuterol (PROVENTIL HFA;VENTOLIN HFA) 108 (90 Base) MCG/ACT inhaler Inhale into the lungs.   Taking  . Elastic Bandages & Supports (COMFORT FIT  MATERNITY SUPP LG) MISC 1 Units by Does not apply route daily. 1 each 0   . Prenat-FeAsp-Meth-FA-DHA w/o A (PRENATE PIXIE) 10-0.6-0.4-200 MG CAPS Take 1 capsule by mouth daily. (Patient not taking: Reported on 09/25/2017) 30 capsule 11 Not Taking  . Prenatal-DSS-FeCb-FeGl-FA (CITRANATAL BLOOM) 90-1 MG TABS Take 1 tablet by mouth daily. 30 tablet 12   . PROAIR HFA 108 (90 Base) MCG/ACT inhaler INHALE 2 PUFFS BY MOUTH INTO THE LUNGS EVERY 6 HOURS AS NEEDED FOR WHEEZING  12 Taking    Review of Systems  Constitutional: Negative for chills and fever.  HENT: Negative for congestion and ear discharge.   Eyes: Negative for discharge and itching.  Respiratory: Negative for apnea and chest tightness.   Cardiovascular: Negative for chest pain and palpitations.  Gastrointestinal: Positive for abdominal pain. Negative for nausea and vomiting.  Endocrine: Negative for cold intolerance and heat intolerance.  Genitourinary: Negative for vaginal bleeding and vaginal pain.  Musculoskeletal: Negative for arthralgias and back pain.  Neurological: Negative for dizziness and syncope.   Physical Exam   Blood pressure 138/80, pulse 89, temperature 98.3 F (36.8 C),  temperature source Oral, resp. rate 18, last menstrual period 01/23/2017.  Physical Exam  Constitutional: She is oriented to person, place, and time. She appears well-developed and well-nourished. No distress.  HENT:  Head: Normocephalic and atraumatic.  Eyes: Pupils are equal, round, and reactive to light. Conjunctivae are normal.  Neck: Normal range of motion. Neck supple.  Cardiovascular: Normal rate and intact distal pulses.   Respiratory: Effort normal. No respiratory distress.  GI: Soft. There is no tenderness.  Genitourinary:  Genitourinary Comments: Cervix: 1/thick/-2  Musculoskeletal: Normal range of motion. She exhibits no edema.  Neurological: She is alert and oriented to person, place, and time.  Skin: Skin is warm and dry. No  erythema.  Psychiatric: She has a normal mood and affect. Her behavior is normal.    MAU Course  Procedures EFM:  140 bpm/mod var/pos acels/ no decels Toco: q68min  MDM Recheck cervix in 1 hour, sooner if needed.  Cervix unchanged after 1 hour of observation.   Assessment and Plan  1. False labor before [redacted] weeks gestation in 3rd trimester- reassuring NST. Unchanged cervical exam. given preterm labor precautions. Follow up outpatient.  [employer] 10/01/2017, 9:43 PM

## 2017-10-01 NOTE — MAU Note (Signed)
Pt presents to MAU with c/o intermittent lower abdominal pain that started at 1900. Pt denies vaginal bleeding and no LOF. +FM

## 2017-10-02 ENCOUNTER — Other Ambulatory Visit: Payer: Self-pay | Admitting: Certified Nurse Midwife

## 2017-10-02 ENCOUNTER — Inpatient Hospital Stay (HOSPITAL_COMMUNITY)
Admission: AD | Admit: 2017-10-02 | Discharge: 2017-10-02 | Disposition: A | Discharge status: Home / Self Care | Payer: Medicaid Other | Source: Ambulatory Visit | Attending: Obstetrics & Gynecology | Admitting: Obstetrics & Gynecology

## 2017-10-02 ENCOUNTER — Encounter (HOSPITAL_COMMUNITY): Payer: Self-pay | Admitting: *Deleted

## 2017-10-02 DIAGNOSIS — O0990 Supervision of high risk pregnancy, unspecified, unspecified trimester: Secondary | ICD-10-CM

## 2017-10-02 DIAGNOSIS — O479 False labor, unspecified: Secondary | ICD-10-CM

## 2017-10-02 DIAGNOSIS — O4703 False labor before 37 completed weeks of gestation, third trimester: Secondary | ICD-10-CM | POA: Diagnosis not present

## 2017-10-02 DIAGNOSIS — D508 Other iron deficiency anemias: Secondary | ICD-10-CM

## 2017-10-02 LAB — STREP GP B NAA: Strep Gp B NAA: NEGATIVE

## 2017-10-02 NOTE — MAU Note (Signed)
I have communicated with F. Cresenzo-Dihmon CNM and reviewed vital signs:  Vitals:   10/02/17 2038 10/02/17 2147  BP: 120/70 130/70  Pulse: 75 74  Resp: 16   Temp: 98.1 F (36.7 C)     Vaginal exam:  Dilation: 1.5 Effacement (%): Thick Cervical Position: Posterior Station: -2 Presentation: Vertex Exam by:: B Hayes Czaja RN,   Also reviewed contraction pattern and that non-stress test is reactive.  It has been documented that patient is not contracting at this time. Minimal cervical since yesterday not indicating active labor.  Patient denies any other complaints.  Based on this report provider has given order for discharge.  A discharge order and diagnosis entered by a provider.   Labor discharge instructions reviewed with patient.

## 2017-10-02 NOTE — Discharge Instructions (Signed)

## 2017-10-02 NOTE — MAU Note (Signed)
Plan of care discussed with MAU provider. Pt  having brown bloody show and no active bright red bleeding. MAU provider states that patient should be seen by RN and treated as a labor check.

## 2017-10-02 NOTE — Progress Notes (Signed)
Continue to monitor FHR Tracing for one hour and reevaluate for BPP.

## 2017-10-02 NOTE — MAU Note (Addendum)
Pt reports that she she had some brood discharge when wiping at 5:30.+ FM. Pt reports rare UC's

## 2017-10-02 NOTE — MAU Note (Signed)
SHE FIRST NOTICE  D/C AT 530PM-  BROWN D/C  WITH MUCUS  WITH  SOME LIQUID.   PNC  WITH   FAMINA.     YESTERDAY   IN MAU  - VE  2  CM.   FEELS  BABY MOVE.    SOME  UC'S.

## 2017-10-08 ENCOUNTER — Encounter: Payer: Self-pay | Admitting: Certified Nurse Midwife

## 2017-10-08 ENCOUNTER — Ambulatory Visit (INDEPENDENT_AMBULATORY_CARE_PROVIDER_SITE_OTHER): Payer: Medicaid Other | Admitting: Certified Nurse Midwife

## 2017-10-08 VITALS — BP 115/74 | HR 84 | Wt 212.0 lb

## 2017-10-08 DIAGNOSIS — E559 Vitamin D deficiency, unspecified: Secondary | ICD-10-CM

## 2017-10-08 DIAGNOSIS — D508 Other iron deficiency anemias: Secondary | ICD-10-CM

## 2017-10-08 DIAGNOSIS — O99013 Anemia complicating pregnancy, third trimester: Secondary | ICD-10-CM

## 2017-10-08 DIAGNOSIS — O0990 Supervision of high risk pregnancy, unspecified, unspecified trimester: Secondary | ICD-10-CM

## 2017-10-08 DIAGNOSIS — O0993 Supervision of high risk pregnancy, unspecified, third trimester: Secondary | ICD-10-CM

## 2017-10-08 NOTE — Progress Notes (Signed)
   PRENATAL VISIT NOTE  Subjective:  Caitlin Hartman is a 26 y.o. [phone] at 652w6d being seen today for ongoing prenatal care.  She is currently monitored for the following issues for this low-risk pregnancy and has Supervision of high risk pregnancy, antepartum; Fibromyalgia; Rheumatoid aortitis; Asthma; Vitamin D deficiency; ANA positive; Low vitamin D level; Anemia; and Iron deficiency anemia on her problem list.  Patient reports no complaints.  Contractions: Irritability. Vag. Bleeding: Bloody Show.  Movement: Present. Denies leaking of fluid.   The following portions of the patient's history were reviewed and updated as appropriate: allergies, current medications, past family history, past medical history, past social history, past surgical history and problem list. Problem list updated.  Objective:   Vitals:   10/08/17 0937  BP: 115/74  Pulse: 84  Weight: 212 lb (96.2 kg)    Fetal Status: Fetal Heart Rate (bpm): 140; doppler Fundal Height: 36 cm Movement: Present     General:  Alert, oriented and cooperative. Patient is in no acute distress.  Skin: Skin is warm and dry. No rash noted.   Cardiovascular: Normal heart rate noted  Respiratory: Normal respiratory effort, no problems with respiration noted  Abdomen: Soft, gravid, appropriate for gestational age.  Pain/Pressure: Present     Pelvic: Cervical exam deferred        Extremities: Normal range of motion.  Edema: None  Mental Status:  Normal mood and affect. Normal behavior. Normal judgment and thought content.   Assessment and Plan:  Pregnancy: [id number] at 6552w6d  1. Supervision of high risk pregnancy, antepartum      Doing well.   2. Vitamin D deficiency     Taking weekly vitamin D  3. Other iron deficiency anemia     Taking iron.   Preterm labor symptoms and general obstetric precautions including but not limited to vaginal bleeding, contractions, leaking of fluid and fetal movement were reviewed in detail with the  patient. Please refer to After Visit Summary for other counseling recommendations.  [facility] 1 [facility] (around [facility]15/2017[facility]   Roe Coombsachelle A Nikyla Navedo, CNM

## 2017-10-08 NOTE — Progress Notes (Signed)
Having dark brown discharge x7, [facility]. No odor, pain or burning. Lower abdominal pressure and pain and contractions more when she is laying down.

## 2017-10-09 ENCOUNTER — Encounter: Payer: Self-pay | Admitting: *Deleted

## 2017-10-09 ENCOUNTER — Encounter: Payer: Medicaid Other | Admitting: Obstetrics and Gynecology

## 2017-10-16 ENCOUNTER — Encounter: Payer: Self-pay | Admitting: Certified Nurse Midwife

## 2017-10-16 ENCOUNTER — Ambulatory Visit (INDEPENDENT_AMBULATORY_CARE_PROVIDER_SITE_OTHER): Payer: Medicaid Other | Admitting: Certified Nurse Midwife

## 2017-10-16 ENCOUNTER — Other Ambulatory Visit (HOSPITAL_COMMUNITY)
Admission: RE | Admit: 2017-10-16 | Discharge: 2017-10-16 | Disposition: A | Discharge status: Home / Self Care | Payer: Medicaid Other | Source: Ambulatory Visit | Attending: Certified Nurse Midwife | Admitting: Certified Nurse Midwife

## 2017-10-16 VITALS — BP 116/81 | HR 88 | Wt 210.6 lb

## 2017-10-16 DIAGNOSIS — O0990 Supervision of high risk pregnancy, unspecified, unspecified trimester: Secondary | ICD-10-CM | POA: Insufficient documentation

## 2017-10-16 DIAGNOSIS — E559 Vitamin D deficiency, unspecified: Secondary | ICD-10-CM

## 2017-10-16 DIAGNOSIS — O0993 Supervision of high risk pregnancy, unspecified, third trimester: Secondary | ICD-10-CM

## 2017-10-16 NOTE — Progress Notes (Signed)
Pt c/o vaginal pressure 

## 2017-10-16 NOTE — Progress Notes (Signed)
   PRENATAL VISIT NOTE  Subjective:  Caitlin Hartman is a 26 y.o. [phone] at 611w0d being seen today for ongoing prenatal care.  She is currently monitored for the following issues for this high-risk pregnancy and has Supervision of high risk pregnancy, antepartum; Fibromyalgia; Rheumatoid aortitis; Asthma; Vitamin D deficiency; ANA positive; Low vitamin D level; Anemia; and Iron deficiency anemia on their problem list.  Patient reports no bleeding, no leaking and occasional contractions.  Contractions: Irregular. Vag. Bleeding: None.  Movement: Present. Denies leaking of fluid.   The following portions of the patient's history were reviewed and updated as appropriate: allergies, current medications, past family history, past medical history, past social history, past surgical history and problem list. Problem list updated.  Objective:   Vitals:   10/16/17 0836  BP: 116/81  Pulse: 88  Weight: 210 lb 9.6 oz (95.5 kg)    Fetal Status: Fetal Heart Rate (bpm): 143; doppler Fundal Height: 36 cm Movement: Present  Presentation: Vertex  General:  Alert, oriented and cooperative. Patient is in no acute distress.  Skin: Skin is warm and dry. No rash noted.   Cardiovascular: Normal heart rate noted  Respiratory: Normal respiratory effort, no problems with respiration noted  Abdomen: Soft, gravid, appropriate for gestational age.  Pain/Pressure: Present     Pelvic: Cervical exam performed Dilation: 1.5 Effacement (%): 20 Station: -3, posterior.   Extremities: Normal range of motion.  Edema: None  Mental Status:  Normal mood and affect. Normal behavior. Normal judgment and thought content.     Negative Pooling, Negative Nitrazine paper.  Copious amounts of vaginal discharge present at OS, white chunky: orange swab cultures obtained.   Assessment and Plan:  Pregnancy: [id number] at 10711w0d  1. Supervision of high risk pregnancy, antepartum      Doing well.  Normal discomforts of late pregnancy.   Leukorrhea of pregnancy.    2. Vitamin D deficiency     Taking weekly vitamin D  Term labor symptoms and general obstetric precautions including but not limited to vaginal bleeding, contractions, leaking of fluid and fetal movement were reviewed in detail with the patient. Please refer to After Visit Summary for other counseling recommendations.  Return in about 1 week (around 10/23/2017) for ROB.   Roe Coombsachelle A Alexzia Kasler, CNM

## 2017-10-17 LAB — CERVICOVAGINAL ANCILLARY ONLY
BACTERIAL VAGINITIS: NEGATIVE
Candida vaginitis: NEGATIVE
Chlamydia: NEGATIVE
NEISSERIA GONORRHEA: NEGATIVE
Trichomonas: NEGATIVE

## 2017-10-22 ENCOUNTER — Ambulatory Visit (INDEPENDENT_AMBULATORY_CARE_PROVIDER_SITE_OTHER): Payer: Medicaid Other | Admitting: Certified Nurse Midwife

## 2017-10-22 ENCOUNTER — Other Ambulatory Visit: Payer: Self-pay

## 2017-10-22 ENCOUNTER — Encounter: Payer: Self-pay | Admitting: Certified Nurse Midwife

## 2017-10-22 VITALS — BP 133/83 | HR 89 | Wt 213.3 lb

## 2017-10-22 DIAGNOSIS — E559 Vitamin D deficiency, unspecified: Secondary | ICD-10-CM

## 2017-10-22 DIAGNOSIS — O0990 Supervision of high risk pregnancy, unspecified, unspecified trimester: Secondary | ICD-10-CM

## 2017-10-22 DIAGNOSIS — O0993 Supervision of high risk pregnancy, unspecified, third trimester: Secondary | ICD-10-CM

## 2017-10-22 NOTE — Progress Notes (Signed)
   PRENATAL VISIT NOTE  Subjective:  Caitlin Hartman is a 26 y.o. [phone] at 8933w6d being seen today for ongoing prenatal care.  She is currently monitored for the following issues for this high-risk pregnancy and has Supervision of high risk pregnancy, antepartum; Fibromyalgia; Rheumatoid aortitis; Asthma; Vitamin D deficiency; ANA positive; Low vitamin D level; Anemia; and Iron deficiency anemia on their problem list.  Patient reports no complaints.  Contractions: Irregular. Vag. Bleeding: None.  Movement: Present. Denies leaking of fluid.   The following portions of the patient's history were reviewed and updated as appropriate: allergies, current medications, past family history, past medical history, past social history, past surgical history and problem list. Problem list updated.  Objective:   Vitals:   10/22/17 1539  BP: 133/83  Pulse: 89  Weight: 213 lb 4.8 oz (96.8 kg)    Fetal Status: Fetal Heart Rate (bpm): 146; doppler Fundal Height: 38 cm Movement: Present  Presentation: Vertex  General:  Alert, oriented and cooperative. Patient is in no acute distress.  Skin: Skin is warm and dry. No rash noted.   Cardiovascular: Normal heart rate noted  Respiratory: Normal respiratory effort, no problems with respiration noted  Abdomen: Soft, gravid, appropriate for gestational age.  Pain/Pressure: Present     Pelvic: Cervical exam performed Dilation: 2 Effacement (%): 50 Station: -3  Extremities: Normal range of motion.  Edema: None  Mental Status:  Normal mood and affect. Normal behavior. Normal judgment and thought content.   Assessment and Plan:  Pregnancy: [id number] at 433w6d  1. Vitamin D deficiency     Taking weekly vitamin D.   2. Supervision of high risk pregnancy, antepartum     Doing well.  Discussed IOL timing with Dr. Burnetta Sabinontstant OK for 41 weeks, not treated like a hypertensive d/t normotensive history during this pregnancy.  Does have Fibromyalgia, no Lupus ruled out by  rheumatology last month.    Term labor symptoms and general obstetric precautions including but not limited to vaginal bleeding, contractions, leaking of fluid and fetal movement were reviewed in detail with the patient. Please refer to After Visit Summary for other counseling recommendations.  Return in about 1 week (around 10/29/2017) for ROB.   Roe Coombsachelle A Acelyn Basham, CNM

## 2017-10-23 ENCOUNTER — Inpatient Hospital Stay (HOSPITAL_COMMUNITY)
Admission: AD | Admit: 2017-10-23 | Discharge: 2017-10-23 | Disposition: A | Discharge status: Home / Self Care | Payer: Medicaid Other | Source: Ambulatory Visit | Attending: Obstetrics and Gynecology | Admitting: Obstetrics and Gynecology

## 2017-10-23 DIAGNOSIS — O479 False labor, unspecified: Secondary | ICD-10-CM | POA: Insufficient documentation

## 2017-10-23 DIAGNOSIS — Z3A00 Weeks of gestation of pregnancy not specified: Secondary | ICD-10-CM | POA: Insufficient documentation

## 2017-10-23 NOTE — MAU Note (Signed)
Labor eval 

## 2017-10-23 NOTE — Discharge Instructions (Signed)

## 2017-10-25 ENCOUNTER — Inpatient Hospital Stay (HOSPITAL_COMMUNITY)
Admission: AD | Admit: 2017-10-25 | Discharge: 2017-10-27 | DRG: 807 | Disposition: A | Discharge status: Home / Self Care | Payer: Medicaid Other | Source: Ambulatory Visit | Attending: Obstetrics and Gynecology | Admitting: Obstetrics and Gynecology

## 2017-10-25 ENCOUNTER — Encounter (HOSPITAL_COMMUNITY): Payer: Self-pay

## 2017-10-25 DIAGNOSIS — Z3A39 39 weeks gestation of pregnancy: Secondary | ICD-10-CM | POA: Diagnosis not present

## 2017-10-25 DIAGNOSIS — D509 Iron deficiency anemia, unspecified: Secondary | ICD-10-CM | POA: Diagnosis present

## 2017-10-25 DIAGNOSIS — Z91040 Latex allergy status: Secondary | ICD-10-CM | POA: Diagnosis not present

## 2017-10-25 DIAGNOSIS — O9952 Diseases of the respiratory system complicating childbirth: Secondary | ICD-10-CM | POA: Diagnosis present

## 2017-10-25 DIAGNOSIS — J45909 Unspecified asthma, uncomplicated: Secondary | ICD-10-CM | POA: Diagnosis present

## 2017-10-25 DIAGNOSIS — Z3483 Encounter for supervision of other normal pregnancy, third trimester: Secondary | ICD-10-CM | POA: Diagnosis present

## 2017-10-25 DIAGNOSIS — D508 Other iron deficiency anemias: Secondary | ICD-10-CM

## 2017-10-25 DIAGNOSIS — E559 Vitamin D deficiency, unspecified: Secondary | ICD-10-CM | POA: Diagnosis present

## 2017-10-25 DIAGNOSIS — O0990 Supervision of high risk pregnancy, unspecified, unspecified trimester: Secondary | ICD-10-CM

## 2017-10-25 DIAGNOSIS — O9902 Anemia complicating childbirth: Principal | ICD-10-CM | POA: Diagnosis present

## 2017-10-25 LAB — TYPE AND SCREEN
ABO/RH(D): O POS
ANTIBODY SCREEN: NEGATIVE

## 2017-10-25 LAB — CBC
HEMATOCRIT: 33 % — AB (ref 36.0–46.0)
Hemoglobin: 10.2 g/dL — ABNORMAL LOW (ref 12.0–15.0)
MCH: 21.6 pg — ABNORMAL LOW (ref 26.0–34.0)
MCHC: 30.9 g/dL (ref 30.0–36.0)
MCV: 69.8 fL — ABNORMAL LOW (ref 78.0–100.0)
PLATELETS: 293 10*3/uL (ref 150–400)
RBC: 4.73 MIL/uL (ref 3.87–5.11)
RDW: 16.2 % — AB (ref 11.5–15.5)
WBC: 10 10*3/uL (ref 4.0–10.5)

## 2017-10-25 MED ORDER — ACETAMINOPHEN 325 MG PO TABS: 650.0000 mg | ORAL_TABLET | ORAL | Status: DC | PRN

## 2017-10-25 MED ORDER — SOD CITRATE-CITRIC ACID 500-334 MG/5ML PO SOLN: 30.0000 mL | ORAL | Status: DC | PRN

## 2017-10-25 MED ORDER — LIDOCAINE HCL (PF) 1 % IJ SOLN
INTRAMUSCULAR | Status: AC
  Filled 2017-10-25: qty 30

## 2017-10-25 MED ORDER — LIDOCAINE HCL (PF) 1 % IJ SOLN
30.0000 mL | INTRAMUSCULAR | Status: DC | PRN
  Filled 2017-10-25: qty 30

## 2017-10-25 MED ORDER — OXYTOCIN 10 UNIT/ML IJ SOLN
INTRAMUSCULAR | Status: AC
  Filled 2017-10-25: qty 1

## 2017-10-25 MED ORDER — LACTATED RINGERS IV SOLN
INTRAVENOUS | Status: DC
  Administered 2017-10-25: 20:00:00 via INTRAVENOUS

## 2017-10-25 MED ORDER — LACTATED RINGERS IV SOLN: 500.0000 mL | INTRAVENOUS | Status: DC | PRN

## 2017-10-25 MED ORDER — FLEET ENEMA 7-19 GM/118ML RE ENEM: 1.0000 | ENEMA | RECTAL | Status: DC | PRN

## 2017-10-25 MED ORDER — OXYTOCIN BOLUS FROM INFUSION
500.0000 mL | Freq: Once | INTRAVENOUS | Status: AC
  Administered 2017-10-25: 500 mL via INTRAVENOUS

## 2017-10-25 MED ORDER — OXYTOCIN 40 UNITS IN LACTATED RINGERS INFUSION - SIMPLE MED: 2.5000 [IU]/h | INTRAVENOUS | Status: DC

## 2017-10-25 MED ORDER — ONDANSETRON HCL 4 MG/2ML IJ SOLN: 4.0000 mg | Freq: Four times a day (QID) | INTRAMUSCULAR | Status: DC | PRN

## 2017-10-25 MED ORDER — OXYTOCIN 40 UNITS IN LACTATED RINGERS INFUSION - SIMPLE MED
INTRAVENOUS | Status: AC
  Filled 2017-10-25: qty 1000

## 2017-10-25 NOTE — H&P (Signed)
LABOR AND DELIVERY ADMISSION HISTORY AND PHYSICAL NOTE  Caitlin Hartman is a 26 y.o. female [phone] with IUP at 3858w2d by LMP (01/23/2017) presenting with SROM in active labor. Questionable history gestational hypertension, normotensive during pregnancy, on small dose aspirin. Vit D deficiency on weekly supplementation.   She reports positive fetal movement. She denies leakage of fluid or vaginal bleeding.  Prenatal History/Complications:  Past Medical History: Past Medical History:  Diagnosis Date  . Anemia   . Asthma   . Dyspnea   . Fibromyalgia   . Headache   . Hypertension   . Lyme disease 2013  . Mental disorder   . 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 Vaginal Pap smear, abnormal     Past Surgical History: Past Surgical History:  Procedure Laterality Date  . INDUCED ABORTION     x 3   . TONSILLECTOMY    . WISDOM TOOTH EXTRACTION      Obstetrical History: OB History    Gravida Para Term Preterm AB Living   7 3 3  0 3 3   SAB TAB Ectopic Multiple Live Births                  Social History: Social History   Socioeconomic History  . Marital status: Divorced    Spouse name: None  . Number of children: None  . Years of education: None  . Highest education level: None  Social Needs  . Financial resource strain: None  . Food insecurity - worry: None  . Food insecurity - inability: None  . Transportation needs - medical: None  . Transportation needs - non-medical: None  Occupational History  . None  Tobacco Use  . Smoking status: Never Smoker  . Smokeless tobacco: Never Used  Substance and Sexual Activity  . Alcohol use: No  . Drug use: Yes    Types: Marijuana    Comment: she stop 3 weeks ago  . Sexual activity: Yes    Birth control/protection: None    Comment: currently pregnant  Other Topics Concern  . None  Social History Narrative  . None    Family History: Family History  Problem Relation Age of  Onset  . Alcohol abuse Mother   . Drug abuse Mother   . Depression Mother   . Mental illness Mother   . Drug abuse Father   . Cancer Maternal Aunt   . Diabetes Maternal Uncle   . Alcohol abuse Maternal Grandmother   . Cancer Maternal Grandmother   . Diabetes Maternal Grandmother   . Varicose Veins Maternal Grandmother   . Drug abuse Maternal Grandfather   . Cancer Paternal Grandmother     Allergies: Allergies  Allergen Reactions  . Latex Hives  . Oxycodone-Acetaminophen Nausea And Vomiting    Medications Prior to Admission  Medication Sig Dispense Refill Last Dose  . albuterol (PROVENTIL HFA;VENTOLIN HFA) 108 (90 Base) MCG/ACT inhaler Inhale 1-2 puffs every 4 (four) hours as needed into the lungs for wheezing or shortness of breath.    Past Week at Unknown time  . Elastic Bandages & Supports (COMFORT FIT MATERNITY SUPP LG) MISC 1 Units by Does not apply route daily. 1 each 0 Taking  . Prenatal-DSS-FeCb-FeGl-FA (CITRANATAL BLOOM) 90-1 MG TABS Take 1 tablet by mouth daily. (Patient not taking: Reported on 10/23/2017) 30 tablet 12 Not Taking at Unknown time     Review of Systems   All systems  reviewed and negative except as stated in HPI  Blood pressure (!) 144/87, pulse 78, temperature 99 F (37.2 C), resp. rate 18, last menstrual period 01/23/2017. General appearance: alert, cooperative and no distress Lungs: clear to auscultation bilaterally Heart: regular rate and rhythm Abdomen: soft, non-tender; bowel sounds normal Extremities: No calf swelling or tenderness Presentation: cephalic Fetal monitoring: HR 130130, mod variability, + accels, with variables Uterine activity: Contractions q 2-3 Dilation: Lip/rim Effacement (%): 90 Station: 0 Exam by:: Sandy SalaamLynnsey Johnson, RN    Prenatal labs: ABO, Rh: --/--/O POS (11/17 1945) Antibody: NEG (11/17 1945) Rubella: 3.03 (04/30 1048) RPR: Non Reactive (08/21 1035)  HBsAg: Negative (04/30 1048)  HIV:  Negative GBS: Negative  (10/23 1139)  1 hr Glucola: within normal limit, A1c 5.0 Genetic screening:  Normal Anatomy US: Normal  Prenatal Transfer Tool  Maternal Diabetes: No Genetic Screening: Normal Maternal Ultrasounds/Referrals: Normal Fetal Ultrasounds or other Referrals:  None Maternal Substance Abuse:  No Significant Maternal Medications:  Meds include: Other: Aspirin 81 mg  Significant Maternal Lab Results: Lab values include: Group B Strep negative  Results for orders placed or performed during the hospital encounter of 10/25/17 (from the past 24 hour(s))  CBC   Collection Time: 10/25/17  7:45 PM  Result Value Ref Range   WBC 10.0 4.0 - 10.5 K/uL   RBC 4.73 3.87 - 5.11 MIL/uL   Hemoglobin 10.2 (L) 12.0 - 15.0 g/dL   HCT 86.533.0 (L) 78.436.0 - 69.646.0 %   MCV 69.8 (L) 78.0 - 100.0 fL   MCH 21.6 (L) 26.0 - 34.0 pg   MCHC 30.9 30.0 - 36.0 g/dL   RDW 29.516.2 (H) 28.411.5 - 13.215.5 %   Platelets 293 150 - 400 K/uL  Type and screen [facility]   Collection Time: 10/25/17  7:45 PM  Result Value Ref Range   ABO/RH(D) O POS    Antibody Screen NEG    Sample Expiration 10/28/2017     Patient Active Problem List   Diagnosis Date Noted  . Indication for care in labor and delivery, antepartum 10/25/2017  . Iron deficiency anemia 09/11/2017  . Anemia 08/28/2017  . Vitamin D deficiency 04/16/2017  . ANA positive 04/16/2017  . Low vitamin D level 04/16/2017  . Supervision of high risk pregnancy, antepartum 04/07/2017  . Fibromyalgia 04/07/2017  . Asthma 04/07/2017  . 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 [id number] at 224w2d here for SROM in active labor.  #Labor: Active labor, Contractions q 2-3 min, #Pain: No pain meds or epidural per patient #FWB: Cat 2 #ID:  GBS Negative #MOF: Breast #MOC: IUD #Circ:  N/A  Abdoulaye Diallo 10/25/2017, 8:59 PM  I confirm that I have verified the information documented in the resident's note and that I have also  personally reperformed the physical exam and all medical decision making activities.  The patient was seen and examined by me also Agree with note NST reactive and reassuring UCs as listed Cervical exams as listed in note  Aviva SignsWilliams, Kell Ferris L, CNM

## 2017-10-25 NOTE — MAU Note (Signed)
Pt having mild ctx on and off all day. Water broke about 15 min ago clear fluid. Good fetal movement reported.

## 2017-10-26 ENCOUNTER — Other Ambulatory Visit: Payer: Self-pay

## 2017-10-26 LAB — ABO/RH: ABO/RH(D): O POS

## 2017-10-26 LAB — RPR: RPR Ser Ql: NONREACTIVE

## 2017-10-26 MED ORDER — ALBUTEROL SULFATE (2.5 MG/3ML) 0.083% IN NEBU: 3.0000 mL | INHALATION_SOLUTION | RESPIRATORY_TRACT | Status: DC | PRN

## 2017-10-26 MED ORDER — IBUPROFEN 600 MG PO TABS
600.0000 mg | ORAL_TABLET | Freq: Four times a day (QID) | ORAL | Status: DC
  Administered 2017-10-26 (×3): 600 mg via ORAL
  Filled 2017-10-26 (×6): qty 1

## 2017-10-26 MED ORDER — COCONUT OIL OIL
1.0000 "application " | TOPICAL_OIL | Status: DC | PRN
  Administered 2017-10-26: 1 via TOPICAL
  Filled 2017-10-26: qty 120

## 2017-10-26 MED ORDER — DIPHENHYDRAMINE HCL 25 MG PO CAPS: 25.0000 mg | ORAL_CAPSULE | Freq: Four times a day (QID) | ORAL | Status: DC | PRN

## 2017-10-26 MED ORDER — SENNOSIDES-DOCUSATE SODIUM 8.6-50 MG PO TABS
2.0000 | ORAL_TABLET | ORAL | Status: DC
  Filled 2017-10-26: qty 2

## 2017-10-26 MED ORDER — BENZOCAINE-MENTHOL 20-0.5 % EX AERO: 1.0000 "application " | INHALATION_SPRAY | CUTANEOUS | Status: DC | PRN

## 2017-10-26 MED ORDER — TETANUS-DIPHTH-ACELL PERTUSSIS 5-2.5-18.5 LF-MCG/0.5 IM SUSP: 0.5000 mL | Freq: Once | INTRAMUSCULAR | Status: DC

## 2017-10-26 MED ORDER — ONDANSETRON HCL 4 MG PO TABS: 4.0000 mg | ORAL_TABLET | ORAL | Status: DC | PRN

## 2017-10-26 MED ORDER — ZOLPIDEM TARTRATE 5 MG PO TABS: 5.0000 mg | ORAL_TABLET | Freq: Every evening | ORAL | Status: DC | PRN

## 2017-10-26 MED ORDER — SIMETHICONE 80 MG PO CHEW: 80.0000 mg | CHEWABLE_TABLET | ORAL | Status: DC | PRN

## 2017-10-26 MED ORDER — WITCH HAZEL-GLYCERIN EX PADS: 1.0000 "application " | MEDICATED_PAD | CUTANEOUS | Status: DC | PRN

## 2017-10-26 MED ORDER — PRENATAL MULTIVITAMIN CH
1.0000 | ORAL_TABLET | Freq: Every day | ORAL | Status: DC
  Administered 2017-10-26: 1 via ORAL
  Filled 2017-10-26: qty 1

## 2017-10-26 MED ORDER — ONDANSETRON HCL 4 MG/2ML IJ SOLN: 4.0000 mg | INTRAMUSCULAR | Status: DC | PRN

## 2017-10-26 MED ORDER — DIBUCAINE 1 % RE OINT: 1.0000 "application " | TOPICAL_OINTMENT | RECTAL | Status: DC | PRN

## 2017-10-26 NOTE — Lactation Note (Signed)
This note was copied from a baby's chart. Lactation Consultation Note  Patient Name: Caitlin Hartman ZOXWR'UToday's Date: 10/26/2017 Reason for consult: Initial assessment;Term Breastfeeding consultation services and support information given.  This is mom's fourth baby and newborn is 2812 hours old.  Mom BF her last two babies for 1 month and last 6 months.  Baby is currently latched well and feeding actively.  Instructed to feed with any feeding cue and call for assist prn.  Maternal Data Does the patient have breastfeeding experience prior to this delivery?: Yes  Feeding Feeding Type: Breast Fed  LATCH Score Latch: Grasps breast easily, tongue down, lips flanged, rhythmical sucking.  Audible Swallowing: A few with stimulation  Type of Nipple: Everted at rest and after stimulation  Comfort (Breast/Nipple): Soft / non-tender  Hold (Positioning): No assistance needed to correctly position infant at breast.  LATCH Score: 9  Interventions Interventions: Breast feeding basics reviewed  Lactation Tools Discussed/Used     Consult Status Consult Status: Follow-up Date: 10/27/17 Follow-up type: In-patient    [name], Anniya Whiters S 10/26/2017, 10:25 AM

## 2017-10-26 NOTE — Progress Notes (Signed)
Post Partum Day 1 Subjective: no complaints, up ad lib, voiding and tolerating PO  Objective: Blood pressure 120/74, pulse (!) 59, temperature 98.3 F (36.8 C), temperature source Oral, resp. rate 18, height 5\' 8"  (1.727 m), weight 202 lb (91.6 kg), last menstrual period 01/23/2017, SpO2 98 %, unknown if currently breastfeeding.  Physical Exam:  General: alert, cooperative and no distress  Right Breast:   1-2cm round nodule on right breast above areola.  Nontender, mobile,  Has a dark ring around it but it is nodular/palpable also.    ? Galactocele     Lochia: appropriate Uterine Fundus: firm Incision: healing well DVT Evaluation: No evidence of DVT seen on physical exam.  Recent Labs    10/25/17 1945  HGB 10.2*  HCT 33.0*    Assessment/Plan: Plan for discharge tomorrow, Breastfeeding and Lactation consult Message sent to clinic to sched US  LOS: 1 day   Wynelle BourgeoisMarie Ladonya Jerkins 10/26/2017, 6:20 AM

## 2017-10-27 MED ORDER — IBUPROFEN 600 MG PO TABS: 600.0000 mg | ORAL_TABLET | Freq: Four times a day (QID) | ORAL | 0 refills | Status: DC

## 2017-10-27 NOTE — Lactation Note (Signed)
This note was copied from a baby's chart. Lactation Consultation Note  Patient Name: Caitlin Hartman Today's Date: 10/27/2017  Mom has cracked nipples and unable to tolerate baby latching.  She chooses to give formula and allow nipples to rest.  Baby was just fed formula.  Mom and baby going home.  24 mm nipple shield given to try which may help discomfort.  Manual pump given with instructions.  Lactation outpatient services and support reviewed and encouraged.  Mom active with WIC also.   Maternal Data    Feeding Feeding Type: Breast Fed Length of feed: 30 min  LATCH Score Latch: Grasps breast easily, tongue down, lips flanged, rhythmical sucking.  Audible Swallowing: None  Type of Nipple: Everted at rest and after stimulation  Comfort (Breast/Nipple): Engorged, cracked, bleeding, large blisters, severe discomfort  Hold (Positioning): Assistance needed to correctly position infant at breast and maintain latch.  LATCH Score: 5  Interventions    Lactation Tools Discussed/Used     Consult Status      [name], [name] 10/27/2017, 10:27 AM

## 2017-10-27 NOTE — Discharge Summary (Signed)
OB Discharge Summary     Patient Name: Caitlin Hartman DOB: 09/13/1991 MRN: [id number] Date of admission: 10/25/2017  Delivering MD: Aviva SignsWILLIAMS, MARIE L )  Date of discharge: 10/27/2017    Admitting diagnosis:SROM, active labor Intrauterine pregnancy: 2333w2d    Secondary diagnosis:  Active Problems:   Patient Active Problem List   Diagnosis Date Noted  . Indication for care in labor and delivery, antepartum 10/25/2017  . Iron deficiency anemia 09/11/2017  . Anemia 08/28/2017  . Vitamin D deficiency 04/16/2017  . ANA positive 04/16/2017  . Low vitamin D level 04/16/2017  . Supervision of high risk pregnancy, antepartum 04/07/2017  . Fibromyalgia 04/07/2017  . Asthma 04/07/2017  . 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 yo [id number] at 833w2d was admitted in Active Labor on 10/25/2017. Patient had an uncomplicated labor course as follows:  Membrane Rupture Time/Date: 7:10 PM ,10/25/2017   Intrapartum Procedures: Episiotomy: None [1]                                         Lacerations:  None [1]  Patient had a delivery of a Viable infant. 10/25/2017  Information for the patient's newborn:  Caitlin Hartman, Girl Caitlin Hartman [[id number]][[id number]]  Delivery Method: Vag-Spont    Pateint had an uncomplicated postpartum course.  She is ambulating, tolerating a regular diet, passing flatus, and urinating well. Patient is discharged home in stable condition on 10/27/17.   Physical exam  Vitals:   10/26/17 1856 10/27/17 0544  BP: 122/68 116/66  Pulse: 98 62  Resp: 19 18  Temp: 98.4 F (36.9 C) 97.9 F (36.6 C)  SpO2:      General: alert, cooperative and no distress Lochia: appropriate Uterine Fundus: firm Incision:  N/A DVT Evaluation: No evidence of DVT seen on physical exam.  Labs: No results found for this or any previous visit (from the past 24 hour(s)).   Discharge instruction: per After Visit Summary and "Baby and Me Booklet".  After visit meds:  Allergies  Allergen Reactions  . Latex Hives  . Oxycodone-Acetaminophen Nausea And Vomiting    Allergies as of 10/27/2017      Reactions   Latex Hives   Oxycodone-acetaminophen Nausea And Vomiting      Medication List    STOP taking these medications   COMFORT FIT MATERNITY SUPP LG Misc     TAKE these medications   albuterol 108 (90 Base) MCG/ACT inhaler Commonly known as:  PROVENTIL HFA;VENTOLIN HFA Inhale 1-2 puffs every 4 (four) hours as needed into the lungs for wheezing or shortness of breath.   ibuprofen 600 MG tablet Commonly known as:  ADVIL,MOTRIN Take 1 tablet (600 mg  total) every 6 (six) hours by mouth.        Diet: routine diet  Activity: Advance as tolerated. Pelvic rest for 6 weeks.   Outpatient follow up:4 weeks Future Appointments:  Future Appointments  Date Time Provider Department Center  10/29/2017  8:45 AM Currie ParisBurleson, Terri L, NP [facility] None  11/06/2017  9:30 AM Orvilla Cornwallenney, Rachelle A, CNM [facility] None    Follow up Appt: No Follow-up on file.     Postpartum contraception: IUD  Newborn Data: APGAR (1 MIN): 9   APGAR (5 MINS): 9   APGAR (10 MINS):   @BABYWGTLBSEBC @   Baby Feeding: Breast Disposition:home with mother  Rolm Bookbindermber Lewi Drost, DO  10/27/2017

## 2017-10-27 NOTE — Discharge Instructions (Signed)

## 2017-10-28 ENCOUNTER — Other Ambulatory Visit: Payer: Self-pay | Admitting: Advanced Practice Midwife

## 2017-10-28 ENCOUNTER — Other Ambulatory Visit: Payer: Self-pay

## 2017-10-28 DIAGNOSIS — N6310 Unspecified lump in the right breast, unspecified quadrant: Secondary | ICD-10-CM

## 2017-10-29 ENCOUNTER — Encounter: Payer: Medicaid Other | Admitting: Nurse Practitioner

## 2017-11-05 ENCOUNTER — Other Ambulatory Visit: Payer: Self-pay | Admitting: Advanced Practice Midwife

## 2017-11-05 ENCOUNTER — Ambulatory Visit
Admission: RE | Admit: 2017-11-05 | Discharge: 2017-11-05 | Disposition: A | Discharge status: Home / Self Care | Payer: Medicaid Other | Source: Ambulatory Visit | Attending: Advanced Practice Midwife | Admitting: Advanced Practice Midwife

## 2017-11-05 DIAGNOSIS — N6310 Unspecified lump in the right breast, unspecified quadrant: Secondary | ICD-10-CM

## 2017-11-06 ENCOUNTER — Encounter: Payer: Medicaid Other | Admitting: Certified Nurse Midwife

## 2017-11-08 ENCOUNTER — Encounter: Payer: Self-pay | Admitting: Advanced Practice Midwife

## 2017-11-08 DIAGNOSIS — F4310 Post-traumatic stress disorder, unspecified: Secondary | ICD-10-CM | POA: Insufficient documentation

## 2017-11-08 DIAGNOSIS — N63 Unspecified lump in unspecified breast: Secondary | ICD-10-CM

## 2017-11-08 DIAGNOSIS — F319 Bipolar disorder, unspecified: Secondary | ICD-10-CM

## 2017-11-08 HISTORY — DX: Bipolar disorder, unspecified: F31.9

## 2017-11-08 HISTORY — DX: Unspecified lump in unspecified breast: N63.0

## 2017-11-24 ENCOUNTER — Encounter: Payer: Self-pay | Admitting: *Deleted

## 2017-11-24 ENCOUNTER — Ambulatory Visit (INDEPENDENT_AMBULATORY_CARE_PROVIDER_SITE_OTHER): Payer: Medicaid Other | Admitting: Obstetrics and Gynecology

## 2017-11-24 ENCOUNTER — Encounter: Payer: Self-pay | Admitting: Obstetrics and Gynecology

## 2017-11-24 DIAGNOSIS — Z30430 Encounter for insertion of intrauterine contraceptive device: Secondary | ICD-10-CM

## 2017-11-24 DIAGNOSIS — Z3202 Encounter for pregnancy test, result negative: Secondary | ICD-10-CM

## 2017-11-24 DIAGNOSIS — Z1389 Encounter for screening for other disorder: Secondary | ICD-10-CM | POA: Diagnosis not present

## 2017-11-24 LAB — POCT URINE PREGNANCY: PREG TEST UR: NEGATIVE

## 2017-11-24 MED ORDER — LEVONORGESTREL 20 MCG/24HR IU IUD
INTRAUTERINE_SYSTEM | Freq: Once | INTRAUTERINE | Status: AC
  Administered 2017-11-24: 09:00:00 via INTRAUTERINE

## 2017-11-24 NOTE — Progress Notes (Signed)
Used office supply Mirena

## 2017-11-24 NOTE — Progress Notes (Signed)
Subjective:     Caitlin Hartman is a 26 y.o. female who presents for a postpartum visit. She is 4 weeks postpartum following a spontaneous vaginal delivery. I have fully reviewed the prenatal and intrapartum course. The delivery was at 39 gestational weeks. Outcome: spontaneous vaginal delivery. Anesthesia: none. Postpartum course has been uncomplicated. Baby's course has been uncomplicated. Baby is feeding by breast. Bleeding no bleeding. Bowel function is normal. Bladder function is normal. Patient is not sexually active. Contraception method is abstinence. Postpartum depression screening: negative.     Review of Systems Pertinent items are noted in HPI.   Objective:    BP 136/85   Pulse 70   Wt 193 lb (87.5 kg)   LMP 01/23/2017 (Exact Date)   Breastfeeding? Yes   BMI 29.35 kg/m   General:  alert, cooperative and no distress   Breasts:  inspection negative, no nipple discharge or bleeding, no masses or nodularity palpable  Lungs: clear to auscultation bilaterally  Heart:  regular rate and rhythm  Abdomen: soft, non-tender; bowel sounds normal; no masses,  no organomegaly   Vulva:  normal  Vagina: normal vagina, no discharge, exudate, lesion, or erythema  Cervix:  multiparous appearance  Corpus: normal size, contour, position, consistency, mobility, non-tender  Adnexa:  normal adnexa and no mass, fullness, tenderness  Rectal Exam:         Assessment:     Normal postpartum exam. Pap smear not done at today's visit (normal 03/2017).   Plan:    1. Contraception: IUD  IUD Procedure Note Patient identified, informed consent performed, signed copy in chart, time out was performed.  Urine pregnancy test negative.  Speculum placed in the vagina.  Cervix visualized.  Cleaned with Betadine x 2.  Grasped anteriorly with a single tooth tenaculum.  Uterus sounded to 8 cm.  Mirena IUD placed per manufacturer's recommendations.  Strings trimmed to 3 cm. Tenaculum was removed, good hemostasis  noted.  Patient tolerated procedure well.   Patient given post procedure instructions and Mirena care card with expiration date.  Patient is asked to check IUD strings periodically and follow up in 4-6 weeks for IUD check.  2. Patient is medically cleared to resume all activities of daily living 3. Follow up in: 4 weeks for IUD check or as needed.

## 2017-11-24 NOTE — Progress Notes (Deleted)
Post Partum Exam  Caitlin Hartman is a 26 y.o. [phone] female who presents for a postpartum visit. She is 4 weeks postpartum following a spontaneous vaginal delivery. I have fully reviewed the prenatal and intrapartum course. The delivery was at 39 gestational weeks.  Anesthesia: none. Postpartum course has been good. Baby's course has been good. Baby is feeding by breast. Bleeding staining only. Bowel function is normal. Bladder function is normal. Patient is not sexually active. Contraception method is none. Postpartum depression screening:neg  {Common ambulatory SmartLinks:19316}  Review of Systems {ros; complete:30496}    Objective:  Last menstrual period 01/23/2017, unknown if currently breastfeeding.  General:  {gen appearance:16600}   Breasts:  {breast exam:1202::"inspection negative, no nipple discharge or bleeding, no masses or nodularity palpable"}  Lungs: {lung exam:16931}  Heart:  {heart exam:5510}  Abdomen: {abdomen exam:16834}   Vulva:  {labia exam:12198}  Vagina: {vagina exam:12200}  Cervix:  {cervix exam:14595}  Corpus: {uterus exam:12215}  Adnexa:  {adnexa exam:12223}  Rectal Exam: {rectal/vaginal exam:12274}        Assessment:    *** postpartum exam. Pap smear {done:10129} at today's visit.   Plan:   1. Contraception: {method:5051} 2. *** 3. Follow up in: {1-10:13787} {time; units:19136} or as needed.

## 2017-12-22 ENCOUNTER — Encounter: Payer: Self-pay | Admitting: Obstetrics and Gynecology

## 2017-12-22 ENCOUNTER — Ambulatory Visit (INDEPENDENT_AMBULATORY_CARE_PROVIDER_SITE_OTHER): Payer: Medicaid Other | Admitting: Obstetrics and Gynecology

## 2017-12-22 VITALS — BP 147/92 | HR 51 | Ht 68.0 in | Wt 206.3 lb

## 2017-12-22 DIAGNOSIS — Z30431 Encounter for routine checking of intrauterine contraceptive device: Secondary | ICD-10-CM | POA: Diagnosis not present

## 2017-12-22 NOTE — Progress Notes (Signed)
Patient is in the office for string check, IUD placed 11-24-17.

## 2017-12-22 NOTE — Progress Notes (Signed)
27 yo [id number] here for IUD check. Patient reports feeling well. She denies any vaginal bleeding, discharge or pelvic pain. She has been sexually active without any complaints  Past Medical History:  Diagnosis Date  . Anemia   . Asthma   . Dyspnea   . Fibromyalgia   . Headache   . Hypertension   . Lyme disease 2013  . Mental disorder   . 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 Vaginal Pap smear, abnormal    Past Surgical History:  Procedure Laterality Date  . INDUCED ABORTION     x 3   . TONSILLECTOMY    . WISDOM TOOTH EXTRACTION     Family History  Problem Relation Age of Onset  . Alcohol abuse Mother   . Drug abuse Mother   . Depression Mother   . Mental illness Mother   . Drug abuse Father   . Cancer Maternal Aunt   . Breast cancer Maternal Aunt   . Diabetes Maternal Uncle   . Alcohol abuse Maternal Grandmother   . Cancer Maternal Grandmother   . Diabetes Maternal Grandmother   . Varicose Veins Maternal Grandmother   . Drug abuse Maternal Grandfather   . Cancer Paternal Grandmother   . Breast cancer Paternal Grandmother    Social History   Tobacco Use  . Smoking status: Never Smoker  . Smokeless tobacco: Never Used  Substance Use Topics  . Alcohol use: No  . Drug use: No    Comment: she stop 3 weeks ago   ROS See pertinent in HPI  Blood pressure (!) 147/92, pulse (!) 51, height 5\' 8"  (1.727 m), weight 206 lb 4.8 oz (93.6 kg), last menstrual period 01/23/2017, currently breastfeeding. GENERAL: Well-developed, well-nourished female in no acute distress.  ABDOMEN: Soft, nontender, nondistended. No organomegaly. PELVIC: Normal external female genitalia. Vagina is pink and rugated.  Normal discharge. Normal appearing cervix with IUD strings extending 3 cm from os. Uterus is normal in size. No adnexal mass or tenderness. EXTREMITIES: No cyanosis, clubbing, or edema, 2+ distal pulses.  A/P 27 yo here for IUD check -  Reassurance provided as IUD appears to be in the appropriate location - Patient due for annual exam in May - Patient with elevated BP. Patient to follow up with PCP. Discussed some lifestyle modification - RTC prn

## 2018-02-25 ENCOUNTER — Other Ambulatory Visit: Payer: Self-pay

## 2018-02-25 ENCOUNTER — Ambulatory Visit (INDEPENDENT_AMBULATORY_CARE_PROVIDER_SITE_OTHER): Payer: Medicaid Other | Admitting: Family Medicine

## 2018-02-25 ENCOUNTER — Encounter: Payer: Self-pay | Admitting: Family Medicine

## 2018-02-25 VITALS — BP 118/80 | HR 82 | Temp 98.9°F | Ht 68.0 in | Wt 201.4 lb

## 2018-02-25 DIAGNOSIS — D649 Anemia, unspecified: Secondary | ICD-10-CM | POA: Diagnosis not present

## 2018-02-25 DIAGNOSIS — M797 Fibromyalgia: Secondary | ICD-10-CM | POA: Diagnosis not present

## 2018-02-25 DIAGNOSIS — R7989 Other specified abnormal findings of blood chemistry: Secondary | ICD-10-CM | POA: Diagnosis not present

## 2018-02-25 DIAGNOSIS — Z7689 Persons encountering health services in other specified circumstances: Secondary | ICD-10-CM

## 2018-02-25 DIAGNOSIS — N63 Unspecified lump in unspecified breast: Secondary | ICD-10-CM

## 2018-02-25 MED ORDER — DULOXETINE HCL 30 MG PO CPEP: 30.0000 mg | ORAL_CAPSULE | Freq: Every day | ORAL | 3 refills | Status: DC

## 2018-02-25 NOTE — Patient Instructions (Signed)
Thank you for coming in today, it was so nice to see you! Today we talked about:    Fibromyalgia: We have started you on a medicine called Cymbalta.  Please take 1 pill every day.  I would like for you to follow-up with your rheumatologist, please call them to schedule an appointment.  If you cannot see them within the next month please make an appointment here for follow-up  You have a ultrasound scheduled of your right breast in May the appointment time and place as listed below.  We are checking her blood work today, checking your hemoglobin and vitamin D  Bring in all your medications or supplements to each appointment for review.   If we ordered any tests today, you will be notified via telephone of any abnormalities. If everything is normal you will get a letter in the mail.   If you have any questions or concerns, please do not hesitate to call the office at [phone]. You can also message me directly via MyChart.   Sincerely,  Anders Simmondshristina Cyprus Kuang, MD

## 2018-02-25 NOTE — Progress Notes (Signed)
   HPI:  Caitlin Hartman is a 27 y.o. year old female who presents today for a new patient appointment to establish general primary care, also to discuss:  1. Fibromyalgia: Patient notes that she has had fibromyalgia since the early 2000.  She notes that it is very painful "everywhere".  She has trouble sitting on the floor and then standing back up to date her pain.  She mostly has pain in her knees, toes, fingers, shoulder, back.  She has previously followed with [facility] but notes that she was not given any medications secondary to her pregnancy.  She delivered 3 months ago.  She was previously on baclofen but this made her very sleepy.  She cannot remember any other medicines that she was on.  ROS: See HPI  Past Medical Hx:  Past Medical History:  Diagnosis Date  . Anemia   . Asthma   . Dyspnea   . Fibromyalgia   . Headache   . Hypertension   . Lyme disease 2013  . Mental disorder   . 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 Vaginal Pap smear, abnormal     Past Surgical Hx:  Past Surgical History:  Procedure Laterality Date  . INDUCED ABORTION     x 3   . TONSILLECTOMY    . WISDOM TOOTH EXTRACTION      Family Hx: updated in Epic  Social Hx:  Lives with: Her boyfriend, 2 sons and 2 daughters.  [place] She drives herself She does not exercise She has never used tobacco She smokes marijuana occasionally No alcohol use Feels safe in her relationship  Notes that she does "nothing" for fun  Health Maintenance:  There are no preventive care reminders to display for this patient.  PHYSICAL EXAM: BP 118/80   Pulse 82   Temp 98.9 F (37.2 C) (Oral)   Ht 5\' 8"  (1.727 m)   Wt 201 lb 6.4 oz (91.4 kg)   LMP 01/25/2018 Comment: spotting now  SpO2 99%   BMI 30.62 kg/m   General: In NAD, well developed, well nourished Cardiovascular: RRR, normal s1 and s2, no rubs, gallops, or murmurs Respiratory: no increased  work of breathing, clear to auscultation bilaterally Breasts: breasts appear normal, no suspicious masses palpated Abdomen: soft, non-tender,non-distended, +BS Extremities: no edema MSK: normal ROM  Neuro: A&O x3, no gross motor defecits  Psych: appropriate mood and affect    ASSESSMENT/PLAN:  Health maintenance: Up-to-date  1. Fibromyalgia: Symptoms uncontrolled.  She is not currently on any medications.  Was previously being seen at [facility] -Continue to stay active and exercise -Started patient on Cymbalta today -[facility]  2. Anemia, unspecified type: Hx of this. Last hgb 10.2 - CBC - Anemia panel  3. Low vitamin D level: Hx of this  - VITAMIN D 25 Hydroxy (Vit-D Deficiency, Fractures)  4. Encounter to establish care with new doctor: All medical history updated in patient's chart.  She is up-to-date on her health maintenance items. -Follow-up in 1 year for next wellness visit  5. Breast mass: Apparently patient had a mass on her right breast on a previous ultrasound.  Did not appreciate any mass on today's exam.  -Patient has a follow-up ultrasound of her right breast in May 2019  Anders Simmondshristina Gambino, MD PGY-3 [facility]

## 2018-02-26 ENCOUNTER — Telehealth: Payer: Self-pay | Admitting: Family Medicine

## 2018-02-26 ENCOUNTER — Encounter: Payer: Self-pay | Admitting: Family Medicine

## 2018-02-26 LAB — VITAMIN D 25 HYDROXY (VIT D DEFICIENCY, FRACTURES): VIT D 25 HYDROXY: 12.2 ng/mL — AB (ref 30.0–100.0)

## 2018-02-26 LAB — CBC
Hemoglobin: 11.8 g/dL (ref 11.1–15.9)
MCH: 22.7 pg — ABNORMAL LOW (ref 26.6–33.0)
MCHC: 31.9 g/dL (ref 31.5–35.7)
MCV: 71 fL — ABNORMAL LOW (ref 79–97)
PLATELETS: 185 10*3/uL (ref 150–379)
RBC: 5.2 x10E6/uL (ref 3.77–5.28)
RDW: 16.7 % — AB (ref 12.3–15.4)
WBC: 4 10*3/uL (ref 3.4–10.8)

## 2018-02-26 LAB — ANEMIA PANEL
FERRITIN: 15 ng/mL (ref 15–150)
Folate, Hemolysate: 265.2 ng/mL
Folate, RBC: 717 ng/mL (ref >498)
Hematocrit: 37 % (ref 34.0–46.6)
IRON SATURATION: 23 % (ref 15–55)
Iron: 67 ug/dL (ref 27–159)
RETIC CT PCT: 0.6 % (ref 0.6–2.6)
TIBC: 291 ug/dL (ref 250–450)
UIBC: 224 ug/dL (ref 131–425)
Vitamin B-12: 184 pg/mL — ABNORMAL LOW (ref 232–1245)

## 2018-02-26 MED ORDER — VITAMIN D 50 MCG (2000 UT) PO CAPS: 1.0000 | ORAL_CAPSULE | Freq: Every day | ORAL | 2 refills | Status: DC

## 2018-02-26 NOTE — Telephone Encounter (Signed)
Called patient to discuss her labs. No answer. Left voicemail  Sent a letter to her home:  Her vitamin D is low, will send in 2000 IU to be taken daily Her vitamin B12 is low, advised she take OTC vitamin B12.   Anders Simmondshristina Ihor Meinzer, MD [facility], PGY-3

## 2018-03-01 ENCOUNTER — Encounter: Payer: Self-pay | Admitting: Family Medicine

## 2018-03-01 DIAGNOSIS — Z975 Presence of (intrauterine) contraceptive device: Secondary | ICD-10-CM | POA: Insufficient documentation

## 2018-03-01 HISTORY — DX: Presence of (intrauterine) contraceptive device: Z97.5

## 2018-03-11 ENCOUNTER — Telehealth: Payer: Self-pay

## 2018-03-11 DIAGNOSIS — H04129 Dry eye syndrome of unspecified lacrimal gland: Secondary | ICD-10-CM

## 2018-03-11 MED ORDER — PROPYLENE GLYCOL 0.95 % OP SOLN: 1.0000 [drp] | Freq: Three times a day (TID) | OPHTHALMIC | 0 refills | Status: DC | PRN

## 2018-03-11 NOTE — Telephone Encounter (Signed)
Patient says she was worked up and tried multiple eye drops for dry eye a few years ago and would like to proceed with a referral. Suggested lubricant eye drops in the interim.  Dani GobbleHillary Fitzgerald, MD [facility], PGY-3

## 2018-03-11 NOTE — Telephone Encounter (Signed)
Patient left message asking for a referral to [facility] for dry eye. Call back is [phone]. Ples SpecterAlisa Amaia Lavallie, RN [facility])

## 2018-03-18 DIAGNOSIS — H52223 Regular astigmatism, bilateral: Secondary | ICD-10-CM | POA: Diagnosis not present

## 2018-04-08 ENCOUNTER — Ambulatory Visit: Payer: Medicaid Other | Admitting: Family Medicine

## 2018-04-08 NOTE — Progress Notes (Deleted)
   Subjective:    Patient ID: Caitlin Hartman , female   DOB: [birth year] , 27 y.o..   MRN: [id number]  HPI  Schelly Chuba is here for No chief complaint on file.   1. ***  Review of Systems: Per HPI. All other systems reviewed and are negative.  There are no preventive care reminders to display for this patient.  Past Medical History: Patient Active Problem List   Diagnosis Date Noted  . IUD (intrauterine device) in place 03/01/2018  . Breast mass 11/08/2017  . Bipolar disorder (HCC) 11/08/2017  . PTSD (post-traumatic stress disorder) 11/08/2017  . Indication for care in labor and delivery, antepartum 10/25/2017  . Iron deficiency anemia 09/11/2017  . Anemia 08/28/2017  . Vitamin D deficiency 04/16/2017  . ANA positive 04/16/2017  . Low vitamin D level 04/16/2017  . Fibromyalgia 04/07/2017  . Asthma, mild intermittent 01/31/2017  . Thalassemia trait 08/03/2015  . 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 albuterol (PROVENTIL HFA;VENTOLIN HFA) 108 (90 Base) MCG/ACT inhaler Inhale 1-2 puffs every 4 (four) hours as needed into the lungs for wheezing or shortness of breath.     . Cholecalciferol (VITAMIN D) 2000 units CAPS Take 1 capsule (2,000 Units total) by mouth daily. 30 capsule 2  . DULoxetine (CYMBALTA) 30 MG capsule Take 1 capsule (30 mg total) by mouth daily. 30 capsule 3  . ibuprofen (ADVIL,MOTRIN) 600 MG tablet Take 1 tablet (600 mg total) every 6 (six) hours by mouth. (Patient not taking: Reported on 12/22/2017) 30 tablet 0  . Propylene Glycol 0.95 % SOLN Apply 1-2 drops to eye 3 (three) times daily as needed. 72 each 0   No current facility-administered medications for this visit.     Social Hx:  reports that she has never smoked. She has never used smokeless tobacco.   Objective:   There were no vitals taken for this visit. Physical Exam  Gen: NAD, alert, cooperative  with exam, well-appearing HEENT: NCAT, PERRL, clear conjunctiva, oropharynx clear, supple neck Cardiac: Regular rate and rhythm, normal S1/S2, no murmur, no edema, capillary refill brisk  Respiratory: Clear to auscultation bilaterally, no wheezes, non-labored breathing Gastrointestinal: soft, non tender, non distended, bowel sounds present Skin: no rashes, normal turgor  Neurological: no gross deficits.  Psych: good insight, normal mood and affect  Assessment & Plan:  No problem-specific Assessment & Plan notes found for this encounter.  No orders of the defined types were placed in this encounter.  No orders of the defined types were placed in this encounter.   Anders Simmonds, MD [facility], PGY-3

## 2018-04-21 ENCOUNTER — Telehealth: Payer: Self-pay | Admitting: Family Medicine

## 2018-04-21 NOTE — Telephone Encounter (Signed)
Reviewed [facility] rheumatologist notes per Care Everywhere, per October 2018 note they were monitoring her fibromyalgia and following up on a positive ANA so I am confused why the patient is saying this.   [facility] team please have the patient schedule an appointment to discuss fibromyalgia and to be evaluated for any potential referrals. Thank you.   Anders Simmonds, MD [facility], PGY-3

## 2018-04-21 NOTE — Telephone Encounter (Signed)
Pt is calling because her rheumatologist does not treat Fibromyalgia. She would like a referral to a pain clinic. jw

## 2018-04-22 ENCOUNTER — Other Ambulatory Visit: Payer: Self-pay | Admitting: Advanced Practice Midwife

## 2018-04-23 NOTE — Telephone Encounter (Signed)
Appt made for 04/29/2018. Sunday Spillers, CMA

## 2018-04-29 ENCOUNTER — Ambulatory Visit: Payer: Medicaid Other | Admitting: Family Medicine

## 2018-04-29 ENCOUNTER — Encounter: Payer: Self-pay | Admitting: Family Medicine

## 2018-04-29 ENCOUNTER — Other Ambulatory Visit: Payer: Self-pay

## 2018-04-29 VITALS — BP 118/84 | HR 62 | Temp 98.4°F | Ht 68.0 in | Wt 192.6 lb

## 2018-04-29 DIAGNOSIS — M2550 Pain in unspecified joint: Secondary | ICD-10-CM

## 2018-04-29 DIAGNOSIS — M25552 Pain in left hip: Secondary | ICD-10-CM | POA: Diagnosis not present

## 2018-04-29 MED ORDER — IBUPROFEN 600 MG PO TABS: 600.0000 mg | ORAL_TABLET | Freq: Four times a day (QID) | ORAL | 0 refills | Status: DC

## 2018-04-29 MED ORDER — KETOROLAC TROMETHAMINE 30 MG/ML IJ SOLN: 30.0000 mg | Freq: Once | INTRAMUSCULAR | Status: AC
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
## 2018-04-29 NOTE — Patient Instructions (Signed)
Thank you for coming in today, it was so nice to see you! Today we talked about:    Joint pains/fibromyalgia: Please resume the Cymbalta.  Take 1 pill every day.  We gave you a Toradol shot today to decrease inflammation.  Starting tomorrow you can take ibuprofen 600 mg every 6 hours as needed  We are checking blood work today to see if there is something else going on besides fibromyalgia  Please follow up in 3-4 weeks for joint pains. You can schedule this appointment at the front desk before you leave or call the clinic.  If we ordered any tests today, you will be notified via telephone of any abnormalities. If everything is normal you will get a letter in the mail.   If you have any questions or concerns, please do not hesitate to call the office at [phone]. You can also message me directly via MyChart.   Sincerely,  Anders Simmonds, MD

## 2018-05-01 ENCOUNTER — Encounter: Payer: Self-pay | Admitting: Family Medicine

## 2018-05-01 LAB — RHEUMATOID ARTHRITIS PROFILE: CYCLIC CITRULLIN PEPTIDE AB: 6 U (ref 0–19)

## 2018-05-01 LAB — ANA: Anti Nuclear Antibody(ANA): NEGATIVE

## 2018-05-05 ENCOUNTER — Ambulatory Visit
Admission: RE | Admit: 2018-05-05 | Discharge: 2018-05-05 | Disposition: A | Discharge status: Home / Self Care | Payer: Medicaid Other | Source: Ambulatory Visit | Attending: Advanced Practice Midwife | Admitting: Advanced Practice Midwife

## 2018-05-05 DIAGNOSIS — N6310 Unspecified lump in the right breast, unspecified quadrant: Secondary | ICD-10-CM

## 2018-06-18 DIAGNOSIS — F319 Bipolar disorder, unspecified: Secondary | ICD-10-CM | POA: Diagnosis not present

## 2018-06-23 ENCOUNTER — Encounter: Payer: Self-pay | Admitting: Student in an Organized Health Care Education/Training Program

## 2018-06-23 ENCOUNTER — Ambulatory Visit: Payer: Medicaid Other | Admitting: Student in an Organized Health Care Education/Training Program

## 2018-06-23 ENCOUNTER — Other Ambulatory Visit: Payer: Self-pay

## 2018-06-23 DIAGNOSIS — M797 Fibromyalgia: Secondary | ICD-10-CM | POA: Diagnosis present

## 2018-06-23 DIAGNOSIS — F411 Generalized anxiety disorder: Secondary | ICD-10-CM | POA: Insufficient documentation

## 2018-06-23 MED ORDER — DULOXETINE HCL 60 MG PO CPEP: 60.0000 mg | ORAL_CAPSULE | Freq: Every day | ORAL | 0 refills | Status: DC

## 2018-06-23 NOTE — Progress Notes (Signed)
Subjective:    Caitlin Hartman - 27 y.o. female MRN [id number]  Date of birth: 26-Apr-1991  HPI  Caitlin Hartman is here for medial thigh pain.  Patient has a history of fibromyalgia, and was most recently seen in our clinic for body pain/arthralgias of numerous joints on 04/29/2018. She was previously diagnosed with fibromyalgia and started on cymbalta 30 mg daily.  Additionally, she was worked up for rhematoid arthritis with rheumatoid arthritis labs which were negative.   At her last visit in 04/2018 she was also thought to have greater trochanteric bursitis of the left hip which was treated with ibuprofen and a ketorolac injection. No steroid injection was done at that visit.   Today, patient endorses right medial thigh discomfort that is worse when she is driving.  She endorses that her leg falls asleep when she is driving as well.  No redness, or fevers. She denies a palpable mass.  Pain is described as pinching.  She does have fibromyalgia with trigger points all of her body including shoulders, upper back.  Health Maintenance:   -  reports that she has never smoked. She has never used smokeless tobacco. - Review of Systems: Per HPI. - Past Medical History: Patient Active Problem List   Diagnosis Date Noted  . Anxiety state 06/23/2018  . IUD (intrauterine device) in place 03/01/2018  . Breast mass 11/08/2017  . Bipolar disorder (HCC) 11/08/2017  . PTSD (post-traumatic stress disorder) 11/08/2017  . Indication for care in labor and delivery, antepartum 10/25/2017  . Iron deficiency anemia 09/11/2017  . Anemia 08/28/2017  . Vitamin D deficiency 04/16/2017  . ANA positive 04/16/2017  . Low vitamin D level 04/16/2017  . Fibromyalgia 04/07/2017  . Asthma, mild intermittent 01/31/2017  . Thalassemia trait 08/03/2015  . 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 albuterol (PROVENTIL  HFA;VENTOLIN HFA) 108 (90 Base) MCG/ACT inhaler Inhale 1-2 puffs every 4 (four) hours as needed into the lungs for wheezing or shortness of breath.     . Cholecalciferol (VITAMIN D) 2000 units CAPS Take 1 capsule (2,000 Units total) by mouth daily. 30 capsule 2  . DULoxetine (CYMBALTA) 60 MG capsule Take 1 capsule (60 mg total) by mouth daily. 90 capsule 0  . ibuprofen (ADVIL,MOTRIN) 600 MG tablet Take 1 tablet (600 mg total) by mouth every 6 (six) hours. 30 tablet 0  . Propylene Glycol 0.95 % SOLN Apply 1-2 drops to eye 3 (three) times daily as needed. 72 each 0   No current facility-administered medications for this visit.     Review of Systems See HPI     Objective:   Physical Exam BP 106/70   Pulse 75   Temp 98.3 F (36.8 C) (Oral)   Ht 5\' 8"  (1.727 m)   Wt 187 lb 9.6 oz (85.1 kg)   SpO2 99%   BMI 28.52 kg/m  Gen: NAD, alert, cooperative with exam, well-appearing  Hip, right : No ttp. No obvious edema. ROM full in all directions (IR: 80/ ER: 80/Flex: 120/Ext: 100/Abd: 45/Add: 45); Strength 5/5 in IR/ER/Flex/Ext/Abd/Add. Pelvic alignment unremarkable to inspection and palpation. Hip rotation and gait without trendelenburg / unsteadiness. Greater trochanter without tenderness to palpation.  EXT: left medial thigh with ttp. No palpable mass. No redness or erythema. No rash. Skin: no rashes, normal turgor  Neuro: no gross deficits.  Psych: good insight, alert and oriented     Assessment &  Plan:   Fibromyalgia Discomfort seems most consistent with fibromyalgia.  Patient is currently on 30 mg of Cymbalta, has room to increase this.  Additionally she screens positive for anxiety with a gad 7 score of 12.  The Cymbalta may additionally help with her anxiety. -Follow-up in 2 to 4 weeks if symptoms worsen or fail to improve  Anxiety state Gad 7 score of 12.  Increasing Cymbalta dosage for fibromyalgia.  Will retest gad 7 at follow-up to see if anxiety improves.   No orders of  the defined types were placed in this encounter.   Meds ordered this encounter  Medications  . DULoxetine (CYMBALTA) 60 MG capsule    Sig: Take 1 capsule (60 mg total) by mouth daily.    Dispense:  90 capsule    Refill:  0    Howard PouchLauren Dublin Grayer, MD,MS,  PGY2 06/23/2018 10:45 AM

## 2018-06-23 NOTE — Assessment & Plan Note (Signed)
Discomfort seems most consistent with fibromyalgia.  Patient is currently on 30 mg of Cymbalta, has room to increase this.  Additionally she screens positive for anxiety with a gad 7 score of 12.  The Cymbalta may additionally help with her anxiety. -Follow-up in 2 to 4 weeks if symptoms worsen or fail to improve

## 2018-06-23 NOTE — Assessment & Plan Note (Signed)
Gad 7 score of 12.  Increasing Cymbalta dosage for fibromyalgia.  Will retest gad 7 at follow-up to see if anxiety improves.

## 2018-06-23 NOTE — Patient Instructions (Signed)
It was a pleasure seeing you today in our clinic. Today we discussed your leg discomfort. Here is the treatment plan we have discussed and agreed upon together:  We increased the dose of your cymbalta. Please try this new dose and follow up in our clinic in 2-4 weeks if symptoms worsen or fail to improve.  Our clinic's number is [phone]. Please call with questions or concerns about what we discussed today.  Be well, Dr. Mosetta PuttFeng  Sign up for My Chart to have easy access to your labs results, and communication with your primary care physician.

## 2018-07-02 DIAGNOSIS — F319 Bipolar disorder, unspecified: Secondary | ICD-10-CM | POA: Diagnosis not present

## 2018-07-09 DIAGNOSIS — F319 Bipolar disorder, unspecified: Secondary | ICD-10-CM | POA: Diagnosis not present

## 2018-07-30 DIAGNOSIS — F319 Bipolar disorder, unspecified: Secondary | ICD-10-CM | POA: Diagnosis not present

## 2018-08-03 DIAGNOSIS — F411 Generalized anxiety disorder: Secondary | ICD-10-CM | POA: Diagnosis not present

## 2018-08-03 DIAGNOSIS — F4312 Post-traumatic stress disorder, chronic: Secondary | ICD-10-CM | POA: Diagnosis not present

## 2018-08-03 DIAGNOSIS — F3132 Bipolar disorder, current episode depressed, moderate: Secondary | ICD-10-CM | POA: Diagnosis not present

## 2018-08-13 DIAGNOSIS — F319 Bipolar disorder, unspecified: Secondary | ICD-10-CM | POA: Diagnosis not present

## 2018-08-14 DIAGNOSIS — Z23 Encounter for immunization: Secondary | ICD-10-CM | POA: Diagnosis not present

## 2018-08-27 DIAGNOSIS — F319 Bipolar disorder, unspecified: Secondary | ICD-10-CM | POA: Diagnosis not present

## 2018-08-31 DIAGNOSIS — F4312 Post-traumatic stress disorder, chronic: Secondary | ICD-10-CM | POA: Diagnosis not present

## 2018-08-31 DIAGNOSIS — F411 Generalized anxiety disorder: Secondary | ICD-10-CM | POA: Diagnosis not present

## 2018-08-31 DIAGNOSIS — F3132 Bipolar disorder, current episode depressed, moderate: Secondary | ICD-10-CM | POA: Diagnosis not present

## 2018-09-03 DIAGNOSIS — F319 Bipolar disorder, unspecified: Secondary | ICD-10-CM | POA: Diagnosis not present

## 2018-09-10 DIAGNOSIS — F319 Bipolar disorder, unspecified: Secondary | ICD-10-CM | POA: Diagnosis not present

## 2018-09-14 ENCOUNTER — Ambulatory Visit: Payer: Medicaid Other | Admitting: Advanced Practice Midwife

## 2018-09-14 NOTE — Progress Notes (Deleted)
  GYNECOLOGY PROGRESS NOTE  History:  27 y.o. [id number] presents to [facility] office today for problem gyn visit. She reports *****.   Her Liletta IUD was placed 11/24/2017 at her 4 week postpartum visit following normal vaginal delivery. She denies h/a, dizziness, shortness of breath, n/v, or fever/chills.    The following portions of the patient's history were reviewed and updated as appropriate: allergies, current medications, past family history, past medical history, past social history, past surgical history and problem list. Last pap smear on *** was normal, *** HRHPV.  Review of Systems:  Pertinent items are noted in HPI.   Objective:  Physical Exam currently breastfeeding. VS reviewed, nursing note reviewed,  Constitutional: well developed, well nourished, no distress HEENT: normocephalic CV: normal rate Pulm/chest wall: normal effort Breast Exam: deferred Abdomen: soft Neuro: alert and oriented x 3 Skin: warm, dry Psych: affect normal Pelvic exam: Cervix pink, visually closed, without lesion, scant white creamy discharge, vaginal walls and external genitalia normal Bimanual exam: Cervix 0/long/high, firm, anterior, neg CMT, uterus nontender, nonenlarged, adnexa without tenderness, enlargement, or mass  Assessment & Plan:  1. Breakthrough bleeding associated with intrauterine device (IUD) ***   Sharen Counter, CNM 1:26 PM

## 2018-09-16 ENCOUNTER — Ambulatory Visit: Payer: Medicaid Other | Admitting: Internal Medicine

## 2018-09-17 ENCOUNTER — Ambulatory Visit (INDEPENDENT_AMBULATORY_CARE_PROVIDER_SITE_OTHER): Payer: Medicaid Other | Admitting: Certified Nurse Midwife

## 2018-09-17 ENCOUNTER — Other Ambulatory Visit (HOSPITAL_COMMUNITY)
Admission: RE | Admit: 2018-09-17 | Discharge: 2018-09-17 | Disposition: A | Discharge status: Home / Self Care | Payer: Medicaid Other | Source: Ambulatory Visit | Attending: Advanced Practice Midwife | Admitting: Advanced Practice Midwife

## 2018-09-17 ENCOUNTER — Encounter: Payer: Self-pay | Admitting: Certified Nurse Midwife

## 2018-09-17 VITALS — BP 129/82 | HR 78 | Wt 182.0 lb

## 2018-09-17 DIAGNOSIS — R102 Pelvic and perineal pain: Secondary | ICD-10-CM

## 2018-09-17 DIAGNOSIS — N760 Acute vaginitis: Secondary | ICD-10-CM | POA: Diagnosis not present

## 2018-09-17 DIAGNOSIS — N898 Other specified noninflammatory disorders of vagina: Secondary | ICD-10-CM | POA: Diagnosis not present

## 2018-09-17 DIAGNOSIS — B9689 Other specified bacterial agents as the cause of diseases classified elsewhere: Secondary | ICD-10-CM

## 2018-09-17 NOTE — Patient Instructions (Signed)

## 2018-09-17 NOTE — Progress Notes (Signed)
Pt states increase in pelvic pressure x 1 week. Concerned it may be IUD related.

## 2018-09-18 LAB — CERVICOVAGINAL ANCILLARY ONLY
Bacterial vaginitis: POSITIVE — AB
Candida vaginitis: NEGATIVE
Chlamydia: NEGATIVE
Neisseria Gonorrhea: NEGATIVE

## 2018-09-18 MED ORDER — TINIDAZOLE 500 MG PO TABS: 2.0000 g | ORAL_TABLET | Freq: Every day | ORAL | 2 refills | Status: DC

## 2018-09-18 NOTE — Progress Notes (Signed)
GYNECOLOGY PROBLEM VISIT ENCOUNTER NOTE  Subjective:   Caitlin Hartman is a 27 y.o. [id number] female here for a problem visit.  Current complaints: suprapubic pressure and vaginal discharge. She reports regular periods and spotting in between cycles with IUD- patient has hx of abnormal vaginal bleeding. She denies pelvic pain, problems with intercourse or other gynecologic concerns.    Gynecologic History Contraception: IUD Last Pap: 03/2017. Results were: normal with negative HPV  Obstetric History OB History  Gravida Para Term Preterm AB Living  7 4 4  0 3 4  SAB TAB Ectopic Multiple Live Births        0 1    # Outcome Date GA Lbr Len/2nd Weight Sex Delivery Anes PTL Lv  7 Term 10/25/17 [redacted]w[redacted]d 02:25 / 00:31 6 lb 11.1 oz (3.036 kg) F Vag-Spont None  LIV     Birth Comments: WNL   6 AB 2012          5 AB 2012          4 AB 2010          3 Term      Vag-Spont     2 Term      Vag-Spont     1 Term      Vag-Spont       Past Medical History:  Diagnosis Date  . Anemia   . Asthma   . Dyspnea   . Fibromyalgia   . Headache   . Hypertension   . Lyme disease 2013  . Mental disorder   . 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 Vaginal Pap smear, abnormal     Past Surgical History:  Procedure Laterality Date  . INDUCED ABORTION     x 3   . TONSILLECTOMY    . WISDOM TOOTH EXTRACTION      Current Outpatient Medications on File Prior to Visit  Medication Sig Dispense Refill  . albuterol (PROVENTIL HFA;VENTOLIN HFA) 108 (90 Base) MCG/ACT inhaler Inhale 1-2 puffs every 4 (four) hours as needed into the lungs for wheezing or shortness of breath.     . Cholecalciferol (VITAMIN D) 2000 units CAPS Take 1 capsule (2,000 Units total) by mouth daily. 30 capsule 2  . DULoxetine (CYMBALTA) 60 MG capsule Take 1 capsule (60 mg total) by mouth daily. 90 capsule 0  . ibuprofen (ADVIL,MOTRIN) 600 MG tablet Take 1 tablet (600 mg total) by mouth every 6  (six) hours. 30 tablet 0  . Propylene Glycol 0.95 % SOLN Apply 1-2 drops to eye 3 (three) times daily as needed. 72 each 0   No current facility-administered medications on file prior to visit.     Allergies  Allergen Reactions  . Latex Hives  . Oxycodone-Acetaminophen Nausea And Vomiting    Social History:  reports that she has never smoked. She has never used smokeless tobacco. She reports that she has current or past drug history. Drug: Marijuana. She reports that she does not drink alcohol.  Family History  Problem Relation Age of Onset  . Alcohol abuse Mother   . Drug abuse Mother   . Depression Mother   . Mental illness Mother   . Drug abuse Father   . Cancer Maternal Aunt   . Breast cancer Maternal Aunt   . Diabetes Maternal Uncle   . Alcohol abuse Maternal Grandmother   . Cancer Maternal Grandmother   . Diabetes Maternal  Grandmother   . Varicose Veins Maternal Grandmother   . Drug abuse Maternal Grandfather   . Cancer Paternal Grandmother   . Breast cancer Paternal Grandmother     The following portions of the patient's history were reviewed and updated as appropriate: allergies, current medications, past family history, past medical history, past social history, past surgical history and problem list.  Review of Systems Pertinent items noted in HPI and remainder of comprehensive ROS otherwise negative.   Objective:  BP 129/82   Pulse 78   Wt 182 lb (82.6 kg)   BMI 27.67 kg/m  CONSTITUTIONAL: Well-developed, well-nourished female in no acute distress.  HENT:  Normocephalic, atraumatic, External right and left ear normal. Oropharynx is clear and moist SKIN: Skin is warm and dry. No rash noted. Not diaphoretic. No erythema. No pallor. MUSCULOSKELETAL: Normal range of motion. No tenderness.  No cyanosis, clubbing, or edema.  2+ distal pulses. NEUROLOGIC: Alert and oriented to person, place, and time. Normal reflexes, muscle tone coordination. No cranial nerve  deficit noted. PSYCHIATRIC: Normal mood and affect. Normal behavior. Normal judgment and thought content. CARDIOVASCULAR: Normal heart rate noted, regular rhythm RESPIRATORY: Clear to auscultation bilaterally. Effort and breath sounds normal, no problems with respiration noted. ABDOMEN: Soft, normal bowel sounds, no distention noted.  No tenderness, rebound or guarding.  PELVIC: Normal appearing external genitalia; normal appearing vaginal mucosa and cervix.  White thin discharge with odor noted.  Cervicovaginal swab obtained. IUD strings seen. Normal uterine size, no other palpable masses, no uterine or adnexal tenderness.  Assessment and Plan:  1. Vaginal discharge -patient reports discharge with odor or the past week when pressure began, denies changes in partners or concerns of STDs.  - Cervicovaginal ancillary only - tinidazole (TINDAMAX) 500 MG tablet; Take 4 tablets (2,000 mg total) by mouth daily with breakfast. For two days  Dispense: 8 tablet; Refill: 2  2. Suprapubic pressure - Patient reports pressure, urine being decreased and urinary retention after she urinates. She denies having a hx of UTIs, believes the pressure may be IUD related. IUD has been in place for over a year with no complications.  - IUD strings seen on pelvic examination  - Urine Culture  3. Bacterial vaginosis - tinidazole (TINDAMAX) 500 MG tablet; Take 4 tablets (2,000 mg total) by mouth daily with breakfast. For two days  Dispense: 8 tablet; Refill: 2  Will follow up results of vaginal swab and manage accordingly. Routine preventative health maintenance measures emphasized.    Sharyon Cable, CNM [facility] [employer], [facility]

## 2018-09-19 LAB — URINE CULTURE

## 2018-09-24 ENCOUNTER — Telehealth: Payer: Self-pay

## 2018-09-24 NOTE — Telephone Encounter (Signed)
PA approved Tinidazole 500 mg PA number [id number] given to [facility]

## 2018-09-29 DIAGNOSIS — F411 Generalized anxiety disorder: Secondary | ICD-10-CM | POA: Diagnosis not present

## 2018-09-29 DIAGNOSIS — F4312 Post-traumatic stress disorder, chronic: Secondary | ICD-10-CM | POA: Diagnosis not present

## 2018-09-29 DIAGNOSIS — F3132 Bipolar disorder, current episode depressed, moderate: Secondary | ICD-10-CM | POA: Diagnosis not present

## 2018-09-30 ENCOUNTER — Telehealth: Payer: Self-pay | Admitting: Student in an Organized Health Care Education/Training Program

## 2018-09-30 ENCOUNTER — Ambulatory Visit: Payer: Medicaid Other | Admitting: Student in an Organized Health Care Education/Training Program

## 2018-09-30 ENCOUNTER — Other Ambulatory Visit: Payer: Self-pay

## 2018-09-30 ENCOUNTER — Encounter: Payer: Self-pay | Admitting: Student in an Organized Health Care Education/Training Program

## 2018-09-30 VITALS — BP 122/62 | HR 88 | Temp 98.2°F | Ht 68.0 in | Wt 184.4 lb

## 2018-09-30 DIAGNOSIS — M898X9 Other specified disorders of bone, unspecified site: Secondary | ICD-10-CM | POA: Diagnosis not present

## 2018-09-30 NOTE — Patient Instructions (Signed)
It was a pleasure seeing you today in our clinic.   We ordered an Xray at today's visit. I will call or send you a letter with these results. If you do not hear from me within the next week, please give our office a call.  Our clinic's number is [phone]. Please call with questions or concerns about what we discussed today.  Be well, Dr. Mosetta Putt

## 2018-09-30 NOTE — Progress Notes (Signed)
Subjective:    Caitlin Hartman - 28 y.o. female MRN [id number]  Date of birth: Jan 09, 1991  HPI  Caitlin Hartman is here for fibromyalgia and a bump on her left knee.  Fibromyalgia For her fibromyalgia, patient reports she has pain diffusely throughout her body most days of the week. She cites pain in her shoulders, upper back, bilateral legs and shins. She previously was prescribed cymbalta which was titrated up, however she reports that this caused her abdominal upset and so she discontinued the medication.   The patient reports that she followed up with psychiatry and  Was restarted on cymbalta at the 30 mg dose but she was unable to tolerate even the lower dose and so it was discontinued. She was started on fluoxetine 20 mg just yesterday by psychiatry.  Left t knee bony prominence Patient endorses a bony prominence at the proximal tibia that she has noticed growing over the past 3 months. She has discomfort when she kneels or lies on that side. She does not recall an injury or trauma to the knee and she is able to walk on that leg without any difficulty. She has not noticed swelling of the knee. She does not endorse weight loss, fevers, night sweats.  She does not endorse any limited range of mobility at the knee.  She does not have any history of trauma or surgery, knee.  Health Maintenance:  Health Maintenance Due  Topic Date Due  . INFLUENZA VACCINE  07/09/2018    -  reports that she has never smoked. She has never used smokeless tobacco. - Review of Systems: Per HPI. - Past Medical History: Patient Active Problem List   Diagnosis Date Noted  . Anxiety state 06/23/2018  . IUD (intrauterine device) in place 03/01/2018  . Breast mass 11/08/2017  . Bipolar disorder (HCC) 11/08/2017  . PTSD (post-traumatic stress disorder) 11/08/2017  . Indication for care in labor and delivery, antepartum 10/25/2017  . Iron deficiency anemia 09/11/2017  . Anemia 08/28/2017  . Vitamin D  deficiency 04/16/2017  . ANA positive 04/16/2017  . Low vitamin D level 04/16/2017  . Fibromyalgia 04/07/2017  . Asthma, mild intermittent 01/31/2017  . Thalassemia trait 08/03/2015  . 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 albuterol (PROVENTIL HFA;VENTOLIN HFA) 108 (90 Base) MCG/ACT inhaler Inhale 1-2 puffs every 4 (four) hours as needed into the lungs for wheezing or shortness of breath.     . Cholecalciferol (VITAMIN D) 2000 units CAPS Take 1 capsule (2,000 Units total) by mouth daily. 30 capsule 2  . DULoxetine (CYMBALTA) 60 MG capsule Take 1 capsule (60 mg total) by mouth daily. 90 capsule 0  . ibuprofen (ADVIL,MOTRIN) 600 MG tablet Take 1 tablet (600 mg total) by mouth every 6 (six) hours. 30 tablet 0  . Propylene Glycol 0.95 % SOLN Apply 1-2 drops to eye 3 (three) times daily as needed. 72 each 0  . tinidazole (TINDAMAX) 500 MG tablet Take 4 tablets (2,000 mg total) by mouth daily with breakfast. For two days 8 tablet 2   No current facility-administered medications for this visit.     Review of Systems See HPI     Objective:   Physical Exam BP 122/62   Pulse 88   Temp 98.2 F (36.8 C) (Oral)   Ht 5\' 8"  (1.727 m)   Wt 184 lb 6.4 oz (83.6 kg)   SpO2 98%  BMI 28.04 kg/m  Gen: NAD, alert, cooperative with exam, well-appearing GEN: NAD, alert, cooperative, and pleasant. RESPIRATORY: Comfortable work of breathing, speaks in full sentences CV: Regular rate noted, distal extremities well perfused and warm without edema GI: Soft, nondistended SKIN: warm and dry, no rashes or lesions NEURO: II-XII grossly intact MSK: + Left medial bony prominence, overlying skin changes, no warmth or redness, no breakdown of the skin.  No tenderness to palpation.  Full range of motion of the left knee without joint line tenderness or evidence of effusion.  Solid and consistent  endpoints to ACL,  MCL, LCL. PSYCH: AAOx3, appropriate affect       Assessment & Plan:   1. Fibromyalgia -patient just started taking Prozac by the psychiatrist yesterday.  Recommend that she get this medicine a chance to work.  I will update her medicine list to reflect that she is no longer taking the duloxetine.  In addition to Prozac, she can try heat, warm baths, her TENS unit, and exercise.  Modalities such as acupuncture or going to the chiropractor may be found to be beneficial.  She can also try swimming.  She is not very open to exercising due to having little personal time with 4 children. - Next steps: could consider Lyrica or gabapentin  2.  Left fibula bony prominence - notable on exam.  Nontender.  Most likely is normal anatomy, however the patient feels that it is new and it is bothersome to her so we can check an x-ray to make sure that there is no mass or tumor.  Howard Pouch, MD,MS,  PGY3 09/30/2018 1:42 PM

## 2018-09-30 NOTE — Telephone Encounter (Signed)
Pt was seen by Dr. Mosetta Putt today 09/30/18 and sent to GI. Pt called and said the order was still pending and they could not do the scan until the order was signed. Pt said that she was not able to wait on this to be done because she had to get home to her children. Pt asked once this has been signed and sent back to GI for someone to call her and let her know when she can go have scan done. The best contact number is [phone].

## 2018-09-30 NOTE — Telephone Encounter (Signed)
It looks like the order is signed from my end. She can go for the XR at her earliest convenience.

## 2018-10-01 ENCOUNTER — Encounter: Payer: Self-pay | Admitting: Student in an Organized Health Care Education/Training Program

## 2018-10-01 ENCOUNTER — Ambulatory Visit (HOSPITAL_COMMUNITY)
Admission: RE | Admit: 2018-10-01 | Discharge: 2018-10-01 | Disposition: A | Discharge status: Home / Self Care | Payer: Medicaid Other | Source: Ambulatory Visit | Attending: Family Medicine | Admitting: Family Medicine

## 2018-10-01 DIAGNOSIS — M898X9 Other specified disorders of bone, unspecified site: Secondary | ICD-10-CM

## 2018-10-01 DIAGNOSIS — R2242 Localized swelling, mass and lump, left lower limb: Secondary | ICD-10-CM | POA: Diagnosis not present

## 2018-10-01 NOTE — Telephone Encounter (Signed)
Xray has been taken. Sunday Spillers, CMA

## 2018-10-08 DIAGNOSIS — F319 Bipolar disorder, unspecified: Secondary | ICD-10-CM | POA: Diagnosis not present

## 2018-10-22 DIAGNOSIS — F319 Bipolar disorder, unspecified: Secondary | ICD-10-CM | POA: Diagnosis not present

## 2018-10-29 DIAGNOSIS — F319 Bipolar disorder, unspecified: Secondary | ICD-10-CM | POA: Diagnosis not present

## 2018-11-19 DIAGNOSIS — F319 Bipolar disorder, unspecified: Secondary | ICD-10-CM | POA: Diagnosis not present

## 2018-11-26 DIAGNOSIS — F319 Bipolar disorder, unspecified: Secondary | ICD-10-CM | POA: Diagnosis not present

## 2018-12-10 ENCOUNTER — Other Ambulatory Visit: Payer: Self-pay

## 2018-12-10 ENCOUNTER — Encounter (HOSPITAL_COMMUNITY): Payer: Self-pay

## 2018-12-10 ENCOUNTER — Emergency Department (HOSPITAL_COMMUNITY): Payer: Medicaid Other

## 2018-12-10 ENCOUNTER — Emergency Department (HOSPITAL_COMMUNITY)
Admission: EM | Admit: 2018-12-10 | Discharge: 2018-12-10 | Disposition: A | Discharge status: Home / Self Care | Payer: Medicaid Other | Attending: Emergency Medicine | Admitting: Emergency Medicine

## 2018-12-10 DIAGNOSIS — Y999 Unspecified external cause status: Secondary | ICD-10-CM | POA: Insufficient documentation

## 2018-12-10 DIAGNOSIS — W182XXA Fall in (into) shower or empty bathtub, initial encounter: Secondary | ICD-10-CM | POA: Diagnosis not present

## 2018-12-10 DIAGNOSIS — I10 Essential (primary) hypertension: Secondary | ICD-10-CM | POA: Diagnosis not present

## 2018-12-10 DIAGNOSIS — Z91040 Latex allergy status: Secondary | ICD-10-CM | POA: Diagnosis not present

## 2018-12-10 DIAGNOSIS — S46011A Strain of muscle(s) and tendon(s) of the rotator cuff of right shoulder, initial encounter: Secondary | ICD-10-CM | POA: Diagnosis not present

## 2018-12-10 DIAGNOSIS — M25561 Pain in right knee: Secondary | ICD-10-CM | POA: Diagnosis not present

## 2018-12-10 DIAGNOSIS — S46911A Strain of unspecified muscle, fascia and tendon at shoulder and upper arm level, right arm, initial encounter: Secondary | ICD-10-CM | POA: Insufficient documentation

## 2018-12-10 DIAGNOSIS — Y93E1 Activity, personal bathing and showering: Secondary | ICD-10-CM | POA: Diagnosis not present

## 2018-12-10 DIAGNOSIS — S8001XA Contusion of right knee, initial encounter: Secondary | ICD-10-CM | POA: Diagnosis not present

## 2018-12-10 DIAGNOSIS — Y92002 Bathroom of unspecified non-institutional (private) residence single-family (private) house as the place of occurrence of the external cause: Secondary | ICD-10-CM | POA: Insufficient documentation

## 2018-12-10 DIAGNOSIS — Z79899 Other long term (current) drug therapy: Secondary | ICD-10-CM | POA: Insufficient documentation

## 2018-12-10 DIAGNOSIS — J45909 Unspecified asthma, uncomplicated: Secondary | ICD-10-CM | POA: Insufficient documentation

## 2018-12-10 DIAGNOSIS — S8991XA Unspecified injury of right lower leg, initial encounter: Secondary | ICD-10-CM | POA: Diagnosis not present

## 2018-12-10 NOTE — Discharge Instructions (Addendum)
Please read attached information. If you experience any new or worsening signs or symptoms please return to the emergency room for evaluation. Please follow-up with your primary care provider or specialist as discussed.  °

## 2018-12-10 NOTE — ED Triage Notes (Signed)
Pt reports injuring her right leg and right shoulder approx a week ago.  Pt reports hx of chronic pain as well due to fibromyalgia.  Pt denies taking pain medications for FM.

## 2018-12-10 NOTE — ED Provider Notes (Signed)
[facility] Provider Note   CSN: [id number] Arrival date & time: 12/10/18  [id number]     History   Chief Complaint Chief Complaint  Patient presents with  . Knee Pain    HPI Caitlin Hartman is a 28 y.o. female.  HPI   28 year old female with a past medical history of fibromyalgia presents today with complaints of right knee pain.  Patient notes that 1 week ago she slipped in her shower striking the medial aspect of her right knee on the shower.  She notes it swelled up and bruised and was painful.  She notes the pain has continued to persist although the swelling and bruising has gone away.  She denies any instability in the knee, denies any other injuries from the fall.  Patient also notes this week she was trying to pick up her daughter when she felt some pain in her right shoulder.  She notes this is diffuse worse with all range of motion no swelling fever or loss of distal sensation strength motor function.  Past Medical History:  Diagnosis Date  . Anemia   . Asthma   . Dyspnea   . Fibromyalgia   . Headache   . Hypertension   . Lyme disease 2013  . Mental disorder   . 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 Vaginal Pap smear, abnormal     Patient Active Problem List   Diagnosis Date Noted  . Anxiety state 06/23/2018  . IUD (intrauterine device) in place 03/01/2018  . Breast mass 11/08/2017  . Bipolar disorder (HCC) 11/08/2017  . PTSD (post-traumatic stress disorder) 11/08/2017  . Indication for care in labor and delivery, antepartum 10/25/2017  . Iron deficiency anemia 09/11/2017  . Anemia 08/28/2017  . Vitamin D deficiency 04/16/2017  . ANA positive 04/16/2017  . Low vitamin D level 04/16/2017  . Fibromyalgia 04/07/2017  . Asthma, mild intermittent 01/31/2017  . Thalassemia trait 08/03/2015  . 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  INDUCED ABORTION     x 3   . TONSILLECTOMY    . WISDOM TOOTH EXTRACTION       OB History    Gravida  7   Para  4   Term  4   Preterm  0   AB  3   Living  4     SAB      TAB      Ectopic      Multiple  0   Live Births  1            Home Medications    Prior to Admission medications   Medication Sig Start Date End Date Taking? Authorizing Provider  albuterol (PROVENTIL HFA;VENTOLIN HFA) 108 (90 Base) MCG/ACT inhaler Inhale 1-2 puffs every 4 (four) hours as needed into the lungs for wheezing or shortness of breath.  01/31/17 01/31/18  [provider]  Cholecalciferol (VITAMIN D) 2000 units CAPS Take 1 capsule (2,000 Units total) by mouth daily. 02/26/18   Beaulah DinningGambino, Christina M, MD  DULoxetine (CYMBALTA) 60 MG capsule Take 1 capsule (60 mg total) by mouth daily. 06/23/18   Howard PouchFeng, Lauren, MD  ibuprofen (ADVIL,MOTRIN) 600 MG tablet Take 1 tablet (600 mg total) by mouth every 6 (six) hours. 04/29/18   Beaulah DinningGambino, Christina M, MD  Propylene Glycol 0.95 % SOLN Apply 1-2 drops to eye 3 (  three) times daily as needed. 03/11/18   Casey Burkitt, MD  tinidazole [facility]) 500 MG tablet Take 4 tablets (2,000 mg total) by mouth daily with breakfast. For two days 09/18/18   Sharyon Cable, CNM    Family History Family History  Problem Relation Age of Onset  . Alcohol abuse Mother   . Drug abuse Mother   . Depression Mother   . Mental illness Mother   . Drug abuse Father   . Cancer Maternal Aunt   . Breast cancer Maternal Aunt   . Diabetes Maternal Uncle   . Alcohol abuse Maternal Grandmother   . Cancer Maternal Grandmother   . Diabetes Maternal Grandmother   . Varicose Veins Maternal Grandmother   . Drug abuse Maternal Grandfather   . Cancer Paternal Grandmother   . Breast cancer Paternal Grandmother     Social History Social History   Tobacco Use  . Smoking status: Never Smoker  . Smokeless tobacco: Never Used  Substance Use Topics  . Alcohol use:  No  . Drug use: Yes    Types: Marijuana     Allergies   Latex and Oxycodone-acetaminophen   Review of Systems Review of Systems  All other systems reviewed and are negative.    Physical Exam Updated Vital Signs BP 127/82 (BP Location: Right Arm)   Pulse 66   Temp 98.3 F (36.8 C)   Resp 16   Ht 5\' 8"  (1.727 m)   Wt 81.2 kg   LMP 12/03/2018   SpO2 100%   BMI 27.22 kg/m   Physical Exam Vitals signs and nursing note reviewed.  Constitutional:      Appearance: She is well-developed.  HENT:     Head: Normocephalic and atraumatic.  Eyes:     General: No scleral icterus.       Right eye: No discharge.        Left eye: No discharge.     Conjunctiva/sclera: Conjunctivae normal.     Pupils: Pupils are equal, round, and reactive to light.  Neck:     Musculoskeletal: Normal range of motion.     Vascular: No JVD.     Trachea: No tracheal deviation.  Pulmonary:     Effort: Pulmonary effort is normal.     Breath sounds: No stridor.  Musculoskeletal:     Comments: Right knee atraumatic with tenderness along the proximal medial tibia at the knee, no laxity with valgus or varus, anterior posterior tibial translation  Right shoulder atraumatic full active range of motion pain in all directions, grip strength 5 out of 5 radial pulse 2+ sensation intact  Neurological:     Mental Status: She is alert and oriented to person, place, and time.     Coordination: Coordination normal.  Psychiatric:        Behavior: Behavior normal.        Thought Content: Thought content normal.        Judgment: Judgment normal.      ED Treatments / Results  Labs (all labs ordered are listed, but only abnormal results are displayed) Labs Reviewed - No data to display  EKG None  Radiology Dg Knee Complete 4 Views Right  Result Date: 12/10/2018 CLINICAL DATA:  Right knee pain after fall 2 weeks ago. EXAM: RIGHT KNEE - COMPLETE 4+ VIEW COMPARISON:  Radiographs of August 07, 2015. FINDINGS:  No evidence of fracture, dislocation, or joint effusion. No evidence of arthropathy or other focal bone abnormality. Soft tissues are  unremarkable. IMPRESSION: Negative. Electronically Signed   By: Lupita Raider, M.D.   On: 12/10/2018 11:43    Procedures Procedures (including critical care time)  Medications Ordered in ED Medications - No data to display   Initial Impression / Assessment and Plan / ED Course  I have reviewed the triage vital signs and the nursing notes.  Pertinent labs & imaging results that were available during my care of the patient were reviewed by me and considered in my medical decision making (see chart for details).     Labs:   Imaging: DG complete right  Consults:  Therapeutics:  Discharge Meds:   Assessment/Plan: 28 year old female presents today with knee contusion.  No significant signs of trauma, negative plain films discharged symptomatic care and strict return precautions.    Final Clinical Impressions(s) / ED Diagnoses   Final diagnoses:  Contusion of right knee, initial encounter  Strain of right shoulder, initial encounter    ED Discharge Orders    None       Rosalio Loud 12/10/18 1157    Donnetta Hutching, MD 12/12/18 1712

## 2018-12-16 ENCOUNTER — Ambulatory Visit (INDEPENDENT_AMBULATORY_CARE_PROVIDER_SITE_OTHER): Payer: Medicaid Other | Admitting: *Deleted

## 2018-12-16 DIAGNOSIS — Z111 Encounter for screening for respiratory tuberculosis: Secondary | ICD-10-CM

## 2018-12-16 NOTE — Progress Notes (Signed)
Patient is here for a PPD placement.  PPD placed in left forearm (below tattoo).  Patient will return 12/18/2018 to have PPD read. Fleeger, Maryjo Rochester, CMA

## 2018-12-18 ENCOUNTER — Ambulatory Visit: Payer: Medicaid Other | Admitting: *Deleted

## 2018-12-18 DIAGNOSIS — Z111 Encounter for screening for respiratory tuberculosis: Secondary | ICD-10-CM

## 2018-12-18 LAB — TB SKIN TEST
INDURATION: 0 mm
TB SKIN TEST: NEGATIVE

## 2018-12-18 NOTE — Progress Notes (Signed)
Patient is here for a PPD read.  It was placed on 12/16/18 in the left forearm @ 10:30 am.    PPD RESULTS:  Result: Negative Induration: 0 mm  Letter created and given to patient for documentation purposes. Fleeger, Maryjo Rochester, CMA

## 2018-12-22 ENCOUNTER — Other Ambulatory Visit: Payer: Self-pay

## 2018-12-22 ENCOUNTER — Emergency Department (HOSPITAL_COMMUNITY)
Admission: EM | Admit: 2018-12-22 | Discharge: 2018-12-22 | Disposition: A | Discharge status: Home / Self Care | Payer: Medicaid Other | Attending: Emergency Medicine | Admitting: Emergency Medicine

## 2018-12-22 DIAGNOSIS — J029 Acute pharyngitis, unspecified: Secondary | ICD-10-CM | POA: Insufficient documentation

## 2018-12-22 DIAGNOSIS — J45909 Unspecified asthma, uncomplicated: Secondary | ICD-10-CM | POA: Diagnosis not present

## 2018-12-22 DIAGNOSIS — B9789 Other viral agents as the cause of diseases classified elsewhere: Secondary | ICD-10-CM | POA: Diagnosis not present

## 2018-12-22 DIAGNOSIS — F3132 Bipolar disorder, current episode depressed, moderate: Secondary | ICD-10-CM | POA: Diagnosis not present

## 2018-12-22 DIAGNOSIS — F4312 Post-traumatic stress disorder, chronic: Secondary | ICD-10-CM | POA: Diagnosis not present

## 2018-12-22 DIAGNOSIS — I10 Essential (primary) hypertension: Secondary | ICD-10-CM | POA: Diagnosis not present

## 2018-12-22 DIAGNOSIS — Z79899 Other long term (current) drug therapy: Secondary | ICD-10-CM | POA: Insufficient documentation

## 2018-12-22 DIAGNOSIS — F411 Generalized anxiety disorder: Secondary | ICD-10-CM | POA: Diagnosis not present

## 2018-12-22 LAB — GROUP A STREP BY PCR: Group A Strep by PCR: NOT DETECTED

## 2018-12-22 MED ORDER — ACETAMINOPHEN 325 MG PO TABS
650.0000 mg | ORAL_TABLET | Freq: Once | ORAL | Status: AC
  Administered 2018-12-22: 650 mg via ORAL
  Filled 2018-12-22: qty 2

## 2018-12-22 NOTE — ED Triage Notes (Signed)
Pt reports she has cough, congestion, and sore throat x 1 week. Daughter had similar symptoms recently.

## 2018-12-22 NOTE — ED Provider Notes (Signed)
[facility] EMERGENCY DEPARTMENT Provider Note   CSN: [id number] Arrival date & time: 12/22/18  1107     History   Chief Complaint Chief Complaint  Patient presents with  . Sore Throat    HPI Caitlin [facility] is a 28 y.o. female.  HPI   28 year old female presents today with complaints of sore throat.  Patient notes symptoms started approximately 1 week ago with rhinorrhea nasal congestion and sore throat.  She notes minimal cough no shortness of breath.  She denies any fever.  She notes history of tonsillitis as a child and had a tonsillectomy.  She notes her daughter was sick last week with similar symptoms.  She notes using warm tea and honey this morning, no pain medication prior to arrival.  Past Medical History:  Diagnosis Date  . Anemia   . Asthma   . Dyspnea   . Fibromyalgia   . Headache   . Hypertension   . Lyme disease 2013  . Mental disorder   . 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 Vaginal Pap smear, abnormal     Patient Active Problem List   Diagnosis Date Noted  . Anxiety state 06/23/2018  . IUD (intrauterine device) in place 03/01/2018  . Breast mass 11/08/2017  . Bipolar disorder (HCC) 11/08/2017  . PTSD (post-traumatic stress disorder) 11/08/2017  . Indication for care in labor and delivery, antepartum 10/25/2017  . Iron deficiency anemia 09/11/2017  . Anemia 08/28/2017  . Vitamin D deficiency 04/16/2017  . ANA positive 04/16/2017  . Low vitamin D level 04/16/2017  . Fibromyalgia 04/07/2017  . Asthma, mild intermittent 01/31/2017  . Thalassemia trait 08/03/2015  . 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 INDUCED ABORTION     x 3   . TONSILLECTOMY    . WISDOM TOOTH EXTRACTION       OB History    Gravida  7   Para  4   Term  4   Preterm  0   AB  3   Living  4     SAB      TAB      Ectopic      Multiple  0   Live Births  1            Home Medications    Prior to Admission medications   Medication Sig Start Date End Date Taking? Authorizing Provider  albuterol (PROVENTIL HFA;VENTOLIN HFA) 108 (90 Base) MCG/ACT inhaler Inhale 1-2 puffs every 4 (four) hours as needed into the lungs for wheezing or shortness of breath.  01/31/17 01/31/18  [provider]  Cholecalciferol (VITAMIN D) 2000 units CAPS Take 1 capsule (2,000 Units total) by mouth daily. 02/26/18   Beaulah Dinning, MD  DULoxetine (CYMBALTA) 60 MG capsule Take 1 capsule (60 mg total) by mouth daily. 06/23/18   Howard Pouch, MD  ibuprofen (ADVIL,MOTRIN) 600 MG tablet Take 1 tablet (600 mg total) by mouth every 6 (six) hours. 04/29/18   Beaulah Dinning, MD  Propylene Glycol 0.95 % SOLN Apply 1-2 drops to eye 3 (three) times daily as needed. 03/11/18   Casey Burkitt, MD  tinidazole [facility]) 500 MG tablet Take 4 tablets (2,000 mg total) by mouth daily with breakfast. For two days 09/18/18   Sharyon Cable, CNM    Family History Family History  Problem Relation Age  of Onset  . Alcohol abuse Mother   . Drug abuse Mother   . Depression Mother   . Mental illness Mother   . Drug abuse Father   . Cancer Maternal Aunt   . Breast cancer Maternal Aunt   . Diabetes Maternal Uncle   . Alcohol abuse Maternal Grandmother   . Cancer Maternal Grandmother   . Diabetes Maternal Grandmother   . Varicose Veins Maternal Grandmother   . Drug abuse Maternal Grandfather   . Cancer Paternal Grandmother   . Breast cancer Paternal Grandmother     Social History Social History   Tobacco Use  . Smoking status: Never Smoker  . Smokeless tobacco: Never Used  Substance Use Topics  . Alcohol use: No  . Drug use: Yes    Types: Marijuana     Allergies   Latex and Oxycodone-acetaminophen   Review of Systems Review of Systems  All other systems reviewed and are negative.    Physical Exam Updated Vital Signs BP  135/90 (BP Location: Right Arm)   Pulse 80   Temp 98.5 F (36.9 C) (Oral)   Resp 16   Ht 5\' 8"  (1.727 m)   Wt 77.1 kg   LMP 12/03/2018   SpO2 100%   BMI 25.85 kg/m   Physical Exam Vitals signs and nursing note reviewed.  Constitutional:      Appearance: She is well-developed.  HENT:     Head: Normocephalic and atraumatic.     Comments: Oropharynx clear no swelling or edema, no erythema, no pooling excretions, neck supple full active range of motion nontender Eyes:     General: No scleral icterus.       Right eye: No discharge.        Left eye: No discharge.     Conjunctiva/sclera: Conjunctivae normal.     Pupils: Pupils are equal, round, and reactive to light.  Neck:     Musculoskeletal: Normal range of motion.     Vascular: No JVD.     Trachea: No tracheal deviation.  Pulmonary:     Effort: Pulmonary effort is normal. No respiratory distress.     Breath sounds: No stridor. No wheezing, rhonchi or rales.  Chest:     Chest wall: No tenderness.  Neurological:     Mental Status: She is alert and oriented to person, place, and time.     Coordination: Coordination normal.  Psychiatric:        Behavior: Behavior normal.        Thought Content: Thought content normal.        Judgment: Judgment normal.      ED Treatments / Results  Labs (all labs ordered are listed, but only abnormal results are displayed) Labs Reviewed  GROUP A STREP BY PCR    EKG None  Radiology No results found.  Procedures Procedures (including critical care time)  Medications Ordered in ED Medications  acetaminophen (TYLENOL) tablet 650 mg (650 mg Oral Given 12/22/18 1219)     Initial Impression / Assessment and Plan / ED Course  I have reviewed the triage vital signs and the nursing notes.  Pertinent labs & imaging results that were available during my care of the patient were reviewed by me and considered in my medical decision making (see chart for details).     Labs: Strep by  PCR negative  Imaging:  Consults:  Therapeutics:  Discharge Meds:   Assessment/Plan: Patient's presentation was consistent with viral pharyngitis.  Discharged with symptomatic  care and strict return precautions.  She verbalized understanding and agreement to this plan.      Final Clinical Impressions(s) / ED Diagnoses   Final diagnoses:  Viral pharyngitis    ED Discharge Orders    None       Rosalio LoudHedges, Rhys Anchondo, PA-C 12/22/18 1313    Wynetta FinesMessick, Peter C, MD 12/22/18 1821

## 2018-12-22 NOTE — Discharge Instructions (Addendum)
Please read attached information. If you experience any new or worsening signs or symptoms please return to the emergency room for evaluation. Please follow-up with your primary care provider or specialist as discussed.  °

## 2018-12-28 ENCOUNTER — Other Ambulatory Visit: Payer: Self-pay

## 2018-12-28 ENCOUNTER — Ambulatory Visit (INDEPENDENT_AMBULATORY_CARE_PROVIDER_SITE_OTHER): Payer: Medicaid Other | Admitting: Student in an Organized Health Care Education/Training Program

## 2018-12-28 ENCOUNTER — Encounter: Payer: Self-pay | Admitting: Student in an Organized Health Care Education/Training Program

## 2018-12-28 DIAGNOSIS — Z0000 Encounter for general adult medical examination without abnormal findings: Secondary | ICD-10-CM | POA: Diagnosis not present

## 2018-12-28 NOTE — Patient Instructions (Signed)
It was a pleasure seeing you today in our clinic. Here is the treatment plan we have discussed and agreed upon together:  You were seen in our office for a routine physical exam.  Our clinic's number is [phone]. Please call with questions or concerns about what we discussed today.  Be well, Dr. Mosetta Putt

## 2018-12-28 NOTE — Progress Notes (Signed)
   CC: Physical Exam for work  HPI: Caitlin [facility] WAS is a 28 y.o. female with PMH:  Patient Active Problem List   Diagnosis Date Noted  . Routine check-up 12/28/2018  . IUD (intrauterine device) in place 03/01/2018  . Breast mass 11/08/2017  . Bipolar disorder (HCC) 11/08/2017  . PTSD (post-traumatic stress disorder) 11/08/2017  . Indication for care in labor and delivery, antepartum 10/25/2017  . Iron deficiency anemia 09/11/2017  . ANA positive 04/16/2017  . Low vitamin D level 04/16/2017  . Fibromyalgia 04/07/2017  . Asthma, mild intermittent 01/31/2017  . Thalassemia trait 08/03/2015  . 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 She reports that she is starting work driving a bus for the "[employer] she reports that she does not have any paperwork for me to fill out.   She endorses no current symptoms other than right elbow trigger point pain consistent with her fibromyalgia. Note bipolar disorder and PTSD on her problem list. She reports that she takes an SSRI for her fibromyalgia and her other psychiatric disorders are "under control." She reports that she follows with psychiatry.  Patient will be due for pap in 03/2020  Review of Symptoms:   No rhinorrhea, congestion, cough. No N/V/D/C. No urinary frequency or urgency. She does not smoke cigarettes or drink alcohol.  CC, SH/smoking status, and VS noted.  Objective: BP 118/68   Pulse 70   Temp 97.8 F (36.6 C) (Oral)   Ht 5\' 8"  (1.727 m)   Wt 173 lb 6.4 oz (78.7 kg)   LMP 12/03/2018   SpO2 99%   BMI 26.37 kg/m  GEN: NAD, alert, cooperative, and pleasant. EYE: no conjunctival injection, pupils equally round and reactive to light ENMT: normal tympanic light reflex, no nasal polyps,no rhinorrhea, no pharyngeal erythema or exudates NECK: full ROM, no thyromegaly RESPIRATORY: clear to auscultation bilaterally with no wheezes, rhonchi or rales, good effort CV: RRR,  no m/r/g, no peripheral edema GI: soft, non-tender, non-distended, no hepatosplenomegaly SKIN: warm and dry, no rashes or lesions NEURO: II-XII grossly intact, normal gait, peripheral sensation intact PSYCH: AAOx3, appropriate affect  Assessment and plan:  Routine check-up Overall patient is doing well. Denies symptoms. Letter given indicating that she had a routine physical. If she does have paperwork for work she can drop it off and I can fill it out in the future.  Howard Pouch, MD,MS,  PGY3 12/28/2018 3:56 PM

## 2018-12-28 NOTE — Assessment & Plan Note (Signed)
Overall patient is doing well. Denies symptoms. Letter given indicating that she had a routine physical. If she does have paperwork for work she can drop it off and I can fill it out in the future.

## 2018-12-28 NOTE — Progress Notes (Signed)
Opened in error

## 2018-12-30 ENCOUNTER — Encounter (HOSPITAL_COMMUNITY): Payer: Self-pay | Admitting: Emergency Medicine

## 2018-12-30 ENCOUNTER — Emergency Department (HOSPITAL_COMMUNITY): Payer: Medicaid Other

## 2018-12-30 ENCOUNTER — Emergency Department (HOSPITAL_COMMUNITY)
Admission: EM | Admit: 2018-12-30 | Discharge: 2018-12-30 | Disposition: A | Discharge status: Home / Self Care | Payer: Medicaid Other | Attending: Emergency Medicine | Admitting: Emergency Medicine

## 2018-12-30 DIAGNOSIS — Z79899 Other long term (current) drug therapy: Secondary | ICD-10-CM | POA: Diagnosis not present

## 2018-12-30 DIAGNOSIS — R109 Unspecified abdominal pain: Secondary | ICD-10-CM | POA: Diagnosis not present

## 2018-12-30 DIAGNOSIS — F79 Unspecified intellectual disabilities: Secondary | ICD-10-CM | POA: Insufficient documentation

## 2018-12-30 DIAGNOSIS — J101 Influenza due to other identified influenza virus with other respiratory manifestations: Secondary | ICD-10-CM

## 2018-12-30 DIAGNOSIS — R05 Cough: Secondary | ICD-10-CM | POA: Diagnosis present

## 2018-12-30 DIAGNOSIS — J45909 Unspecified asthma, uncomplicated: Secondary | ICD-10-CM | POA: Diagnosis not present

## 2018-12-30 DIAGNOSIS — I10 Essential (primary) hypertension: Secondary | ICD-10-CM | POA: Insufficient documentation

## 2018-12-30 LAB — CBC WITH DIFFERENTIAL/PLATELET
Abs Immature Granulocytes: 0.01 10*3/uL (ref 0.00–0.07)
Basophils Absolute: 0 10*3/uL (ref 0.0–0.1)
Basophils Relative: 1 %
Eosinophils Absolute: 0 10*3/uL (ref 0.0–0.5)
Eosinophils Relative: 0 %
HCT: 36.7 % (ref 36.0–46.0)
Hemoglobin: 11.4 g/dL — ABNORMAL LOW (ref 12.0–15.0)
Immature Granulocytes: 0 %
Lymphocytes Relative: 10 %
Lymphs Abs: 0.5 10*3/uL — ABNORMAL LOW (ref 0.7–4.0)
MCH: 22.5 pg — ABNORMAL LOW (ref 26.0–34.0)
MCHC: 31.1 g/dL (ref 30.0–36.0)
MCV: 72.4 fL — ABNORMAL LOW (ref 80.0–100.0)
MONOS PCT: 7 %
Monocytes Absolute: 0.3 10*3/uL (ref 0.1–1.0)
Neutro Abs: 3.7 10*3/uL (ref 1.7–7.7)
Neutrophils Relative %: 82 %
Platelets: 240 10*3/uL (ref 150–400)
RBC: 5.07 MIL/uL (ref 3.87–5.11)
RDW: 14.6 % (ref 11.5–15.5)
WBC: 4.6 10*3/uL (ref 4.0–10.5)
nRBC: 0 % (ref 0.0–0.2)

## 2018-12-30 LAB — URINALYSIS, ROUTINE W REFLEX MICROSCOPIC
Bilirubin Urine: NEGATIVE
Glucose, UA: NEGATIVE mg/dL
KETONES UR: 5 mg/dL — AB
Leukocytes, UA: NEGATIVE
Nitrite: NEGATIVE
Protein, ur: 30 mg/dL — AB
Specific Gravity, Urine: 1.023 (ref 1.005–1.030)
pH: 7 (ref 5.0–8.0)

## 2018-12-30 LAB — COMPREHENSIVE METABOLIC PANEL
ALT: 33 U/L (ref 0–44)
AST: 27 U/L (ref 15–41)
Albumin: 3.2 g/dL — ABNORMAL LOW (ref 3.5–5.0)
Alkaline Phosphatase: 47 U/L (ref 38–126)
Anion gap: 6 (ref 5–15)
BUN: 6 mg/dL (ref 6–20)
CHLORIDE: 106 mmol/L (ref 98–111)
CO2: 24 mmol/L (ref 22–32)
CREATININE: 0.96 mg/dL (ref 0.44–1.00)
Calcium: 8.5 mg/dL — ABNORMAL LOW (ref 8.9–10.3)
GFR calc Af Amer: >60 mL/min (ref >60)
GFR calc non Af Amer: >60 mL/min (ref >60)
Glucose, Bld: 91 mg/dL (ref 70–99)
Potassium: 3.8 mmol/L (ref 3.5–5.1)
Sodium: 136 mmol/L (ref 135–145)
Total Bilirubin: 0.7 mg/dL (ref 0.3–1.2)
Total Protein: 6.1 g/dL — ABNORMAL LOW (ref 6.5–8.1)

## 2018-12-30 LAB — LIPASE, BLOOD: Lipase: 18 U/L (ref 11–51)

## 2018-12-30 LAB — INFLUENZA PANEL BY PCR (TYPE A & B)
Influenza A By PCR: POSITIVE — AB
Influenza B By PCR: NEGATIVE

## 2018-12-30 LAB — I-STAT BETA HCG BLOOD, ED (MC, WL, AP ONLY): I-stat hCG, quantitative: <5 m[IU]/mL (ref <5)

## 2018-12-30 MED ORDER — SODIUM CHLORIDE 0.9 % IV BOLUS
1000.0000 mL | Freq: Once | INTRAVENOUS | Status: AC
  Administered 2018-12-30: 1000 mL via INTRAVENOUS

## 2018-12-30 MED ORDER — OSELTAMIVIR PHOSPHATE 75 MG PO CAPS: 75.0000 mg | ORAL_CAPSULE | Freq: Two times a day (BID) | ORAL | 0 refills | Status: DC

## 2018-12-30 MED ORDER — FLUTICASONE PROPIONATE 50 MCG/ACT NA SUSP: 1.0000 | Freq: Every day | NASAL | 0 refills | Status: DC

## 2018-12-30 MED ORDER — NAPROXEN 500 MG PO TABS: 500.0000 mg | ORAL_TABLET | Freq: Two times a day (BID) | ORAL | 0 refills | Status: DC

## 2018-12-30 MED ORDER — ONDANSETRON HCL 4 MG/2ML IJ SOLN
4.0000 mg | Freq: Once | INTRAMUSCULAR | Status: AC
  Administered 2018-12-30: 4 mg via INTRAVENOUS
  Filled 2018-12-30: qty 2

## 2018-12-30 MED ORDER — KETOROLAC TROMETHAMINE 15 MG/ML IJ SOLN
15.0000 mg | Freq: Once | INTRAMUSCULAR | Status: AC
  Administered 2018-12-30: 15 mg via INTRAVENOUS
  Filled 2018-12-30: qty 1

## 2018-12-30 MED ORDER — ACETAMINOPHEN 325 MG PO TABS
650.0000 mg | ORAL_TABLET | Freq: Once | ORAL | Status: AC
  Administered 2018-12-30: 650 mg via ORAL
  Filled 2018-12-30: qty 2

## 2018-12-30 MED ORDER — BENZONATATE 100 MG PO CAPS: 100.0000 mg | ORAL_CAPSULE | Freq: Three times a day (TID) | ORAL | 0 refills | Status: DC | PRN

## 2018-12-30 NOTE — ED Triage Notes (Signed)
Pt reports "real bad cough" she was seen for on 1/14, abd pain, n/v/d that began yesterday. Also reports she has fibromyalgia and her recent symptoms are making it worse.

## 2018-12-30 NOTE — Discharge Instructions (Addendum)
You were seen in the emergency today for upper respiratory symptoms and abdominal pain with vomiting & diarrhea. We have diagnosed you with flu A. The rest of your labs were fairly normal, you do have some blood in your urine- have this rechecked by a primary care provider, other labs were similar to prior you have had done. We have prescribed you multiple medications to treat your symptoms.   -Flonase to be used 1 spray in each nostril daily.  This medication is used to treat your congestion.  -Tessalon can be taken once every 8 hours as needed.  This medication is used to treat your cough.  - Naproxen is a nonsteroidal anti-inflammatory medication that will help with pain and swelling. Be sure to take this medication as prescribed with food, 1 pill every 12 hours,  It should be taken with food, as it can cause stomach upset, and more seriously, stomach bleeding. Do not take other nonsteroidal anti-inflammatory medications with this such as Advil, Motrin, Aleve, Mobic, Goodie Powder, or Motrin.    - Tamiflu- this is a medicine to treat the flu, take twice per day for 5 days.   You make take Tylenol per over the counter dosing with these medications.   We have prescribed you new medication(s) today. Discuss the medications prescribed today with your pharmacist as they can have adverse effects and interactions with your other medicines including over the counter and prescribed medications. Seek medical evaluation if you start to experience new or abnormal symptoms after taking one of these medicines, seek care immediately if you start to experience difficulty breathing, feeling of your throat closing, facial swelling, or rash as these could be indications of a more serious allergic reaction  You will need to follow-up with your primary care provider in 1 week if your symptoms have not improved.  If you do not have a primary care provider one is provided in your discharge instructions.  Return to the  emergency department for any new or worsening symptoms including but not limited to persistent fever for 5 days, difficulty breathing, chest pain, rashes, passing out, or any other concerns.

## 2018-12-30 NOTE — ED Notes (Signed)
Pt verbalized understanding of discharged paperwork, prescriptions and follow-up care.

## 2018-12-30 NOTE — ED Provider Notes (Signed)
[facility] Provider Note   CSN: [id number] Arrival date & time: 12/30/18  1035   History   Chief Complaint Chief Complaint  Patient presents with  . Cough  . Abdominal Pain    HPI Caitlin Hartman is a 28 y.o. female with a hx of  fibromyalgia, HTN, bipolar disorder, and PTSD who presents to the ED with complaints of persistent cough & new onset N/V/D & abdominal pain.  Patient states she was seen in the ED 01/14 for URI sxs, had negative strep at that time, and sore throat has resolved, but congestion and cough that is intermittently productive have persistent. She states yesterday she developed N/V/D- had 2 episodes of both emesis & diarrhea, but has had persistent dry heaving. She notes that her abdomen became sore after the vomiting- located in the periumbilical area. She is also feeling achy all over and is unsure if this could be her fibromyalgia. No other specific alleviating/aggravating factors. Denies fever, chills, hematemesis, melena, hematochezia, dysuria, vaginal bleeding/discharge, or recent abx/foreign travel.   HPI  Past Medical History:  Diagnosis Date  . Anemia   . Asthma   . Dyspnea   . Fibromyalgia   . Headache   . Hypertension   . Lyme disease 2013  . Mental disorder   . 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 Vaginal Pap smear, abnormal     Patient Active Problem List   Diagnosis Date Noted  . Routine check-up 12/28/2018  . IUD (intrauterine device) in place 03/01/2018  . Breast mass 11/08/2017  . Bipolar disorder (HCC) 11/08/2017  . PTSD (post-traumatic stress disorder) 11/08/2017  . Indication for care in labor and delivery, antepartum 10/25/2017  . Iron deficiency anemia 09/11/2017  . ANA positive 04/16/2017  . Low vitamin D level 04/16/2017  . Fibromyalgia 04/07/2017  . Asthma, mild intermittent 01/31/2017  . Thalassemia trait 08/03/2015  . 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 INDUCED ABORTION     x 3   . TONSILLECTOMY    . WISDOM TOOTH EXTRACTION       OB History    Gravida  7   Para  4   Term  4   Preterm  0   AB  3   Living  4     SAB      TAB      Ectopic      Multiple  0   Live Births  1            Home Medications    Prior to Admission medications   Medication Sig Start Date End Date Taking? Authorizing Provider  albuterol (PROVENTIL HFA;VENTOLIN HFA) 108 (90 Base) MCG/ACT inhaler Inhale 1-2 puffs every 4 (four) hours as needed into the lungs for wheezing or shortness of breath.  01/31/17 01/31/18  [provider]  Cholecalciferol (VITAMIN D) 2000 units CAPS Take 1 capsule (2,000 Units total) by mouth daily. 02/26/18   Beaulah DinningGambino, Christina M, MD  DULoxetine (CYMBALTA) 60 MG capsule Take 1 capsule (60 mg total) by mouth daily. 06/23/18   Howard PouchFeng, Lauren, MD  ibuprofen (ADVIL,MOTRIN) 600 MG tablet Take 1 tablet (600 mg total) by mouth every 6 (six) hours. 04/29/18   Beaulah DinningGambino, Christina M, MD  Propylene Glycol 0.95 % SOLN Apply 1-2 drops to eye 3 (three) times daily as needed. 03/11/18  Casey BurkittFitzgerald, Hillary Moen, MD  tinidazole [facility](TINDAMAX) 500 MG tablet Take 4 tablets (2,000 mg total) by mouth daily with breakfast. For two days 09/18/18   Sharyon Cableogers, Veronica C, CNM    Family History Family History  Problem Relation Age of Onset  . Alcohol abuse Mother   . Drug abuse Mother   . Depression Mother   . Mental illness Mother   . Drug abuse Father   . Cancer Maternal Aunt   . Breast cancer Maternal Aunt   . Diabetes Maternal Uncle   . Alcohol abuse Maternal Grandmother   . Cancer Maternal Grandmother   . Diabetes Maternal Grandmother   . Varicose Veins Maternal Grandmother   . Drug abuse Maternal Grandfather   . Cancer Paternal Grandmother   . Breast cancer Paternal Grandmother     Social History Social History   Tobacco Use  . Smoking status: Never Smoker    . Smokeless tobacco: Never Used  Substance Use Topics  . Alcohol use: No  . Drug use: Yes    Types: Marijuana     Allergies   Latex and Oxycodone-acetaminophen   Review of Systems Review of Systems  Constitutional: Negative for chills and fever.  HENT: Positive for congestion. Negative for ear pain and sore throat.   Respiratory: Positive for cough. Negative for shortness of breath.   Cardiovascular: Negative for chest pain.  Gastrointestinal: Positive for abdominal pain, diarrhea, nausea and vomiting. Negative for anal bleeding and blood in stool.  Genitourinary: Negative for dysuria, vaginal bleeding and vaginal discharge.  Musculoskeletal: Positive for myalgias.  All other systems reviewed and are negative.    Physical Exam Updated Vital Signs BP 133/83   Pulse 80   Temp 100.2 F (37.9 C) (Oral)   Resp 15   LMP 12/03/2018   SpO2 98%   Physical Exam Vitals signs and nursing note reviewed.  Constitutional:      General: She is not in acute distress.    Appearance: She is well-developed.  HENT:     Head: Normocephalic and atraumatic.     Right Ear: Tympanic membrane is not perforated, erythematous, retracted or bulging.     Left Ear: Tympanic membrane is not perforated, erythematous, retracted or bulging.     Nose: Congestion present.     Right Sinus: No maxillary sinus tenderness or frontal sinus tenderness.     Left Sinus: No maxillary sinus tenderness or frontal sinus tenderness.     Mouth/Throat:     Mouth: Mucous membranes are moist.     Pharynx: Uvula midline. No oropharyngeal exudate or posterior oropharyngeal erythema.     Comments: Posterior oropharynx is symmetric appearing. Patient tolerating own secretions without difficulty. No trismus. No drooling. No hot potato voice. No swelling beneath the tongue, submandibular compartment is soft.  Eyes:     General:        Right eye: No discharge.        Left eye: No discharge.     Conjunctiva/sclera:  Conjunctivae normal.     Pupils: Pupils are equal, round, and reactive to light.  Neck:     Musculoskeletal: Normal range of motion and neck supple. No edema or neck rigidity.  Cardiovascular:     Rate and Rhythm: Normal rate and regular rhythm.     Heart sounds: No murmur.  Pulmonary:     Effort: No respiratory distress.     Breath sounds: Normal breath sounds. No wheezing or rales.  Abdominal:     General:  There is no distension.     Palpations: Abdomen is soft.     Tenderness: There is abdominal tenderness (mild) in the periumbilical area. There is no guarding or rebound. Negative signs include Rovsing's sign and McBurney's sign.  Lymphadenopathy:     Cervical: No cervical adenopathy.  Skin:    General: Skin is warm and dry.     Findings: No rash.  Neurological:     Mental Status: She is alert.  Psychiatric:        Behavior: Behavior normal.      ED Treatments / Results  Labs (all labs ordered are listed, but only abnormal results are displayed) Labs Reviewed  CBC WITH DIFFERENTIAL/PLATELET - Abnormal; Notable for the following components:      Result Value   Hemoglobin 11.4 (*)    MCV 72.4 (*)    MCH 22.5 (*)    Lymphs Abs 0.5 (*)    All other components within normal limits  COMPREHENSIVE METABOLIC PANEL - Abnormal; Notable for the following components:   Calcium 8.5 (*)    Total Protein 6.1 (*)    Albumin 3.2 (*)    All other components within normal limits  URINALYSIS, ROUTINE W REFLEX MICROSCOPIC - Abnormal; Notable for the following components:   Hgb urine dipstick SMALL (*)    Ketones, ur 5 (*)    Protein, ur 30 (*)    Bacteria, UA FEW (*)    All other components within normal limits  INFLUENZA PANEL BY PCR (TYPE A & B) - Abnormal; Notable for the following components:   Influenza A By PCR POSITIVE (*)    All other components within normal limits  LIPASE, BLOOD  I-STAT BETA HCG BLOOD, ED ([facility], [facility], [facility] ONLY)    EKG None  Radiology Dg Abd Acute  W/chest  Result Date: 12/30/2018 CLINICAL DATA:  Mid abdominal pain, upper respiratory symptoms for 2 weeks EXAM: DG ABDOMEN ACUTE W/ 1V CHEST COMPARISON:  None. FINDINGS: There is no evidence of dilated bowel loops or free intraperitoneal air. No radiopaque calculi or other significant radiographic abnormality is seen. Heart size and mediastinal contours are within normal limits. Both lungs are clear. IUD noted in the midline of the pelvis. IMPRESSION: Negative abdominal radiographs.  No acute cardiopulmonary disease. Electronically Signed   By: Judie Petit.  Shick M.D.   On: 12/30/2018 13:30    Procedures Procedures (including critical care time)  Medications Ordered in ED Medications - No data to display   Initial Impression / Assessment and Plan / ED Course  I have reviewed the triage vital signs and the nursing notes.  Pertinent labs & imaging results that were available during my care of the patient were reviewed by me and considered in my medical decision making (see chart for details).   Patient presents to the ED with persistent cough/congestion and new onset N/V/D with body aches & abdominal discomfort. Nontoxic appearing, in no apparent distress, vitals WNL by triage team, on my assessment oral temp 100.9 F. Nasal congestion and mild periumbilical tenderness without peritoneal signs, otherwise benign physical exam. Eval w/ labs & xray.   Work-up reviewed: CBC: No leukocytosis. Baseline anemia.  CMP: Mild hypocalcemia at 8.5. Mildly low protein/albumin. No other electrolyte disturbance. LFTs/renal function WNL.  Lipase: WNL UA: small amount of hematuria, no obvious UTI, no urinary sxs.  Preg test: negative- doubt ectopic Influenza: Positive for influenza A- likely etiology of sxs.  X-ray: Negative for acute process.   Sxs likely related to  influenza A. Repeat abdominal exam is not peritoneal, doubt obstruction/perf, pancreatitis, cholecystitis, appendicitis, diverticulitis, ectopic, PID.  CXR negative for infiltrate- clear lungs, doubt pneumonia. No sinus tenderness to suggest bacterial sinusitis. Oropharynx clear. No meningismus. Tolerating PO here. Elected to receiveTamiflu with additional supportive measures & PCP follow up. I discussed results, treatment plan, need for follow-up, and return precautions with the patient. Provided opportunity for questions, patient confirmed understanding and is in agreement with plan.    Final Clinical Impressions(s) / ED Diagnoses   Final diagnoses:  Influenza A    ED Discharge Orders         Ordered    oseltamivir (TAMIFLU) 75 MG capsule  Every 12 hours     12/30/18 1347    naproxen (NAPROSYN) 500 MG tablet  2 times daily     12/30/18 1347    fluticasone (FLONASE) 50 MCG/ACT nasal spray  Daily     12/30/18 1347    benzonatate (TESSALON) 100 MG capsule  3 times daily PRN     12/30/18 1347           Cristina Mattern, Pleas Koch, PA-C 12/30/18 1349    Pricilla Loveless, MD 12/30/18 1904

## 2018-12-31 DIAGNOSIS — F319 Bipolar disorder, unspecified: Secondary | ICD-10-CM | POA: Diagnosis not present

## 2019-01-07 DIAGNOSIS — F319 Bipolar disorder, unspecified: Secondary | ICD-10-CM | POA: Diagnosis not present

## 2019-01-21 DIAGNOSIS — F319 Bipolar disorder, unspecified: Secondary | ICD-10-CM | POA: Diagnosis not present

## 2019-01-28 DIAGNOSIS — F319 Bipolar disorder, unspecified: Secondary | ICD-10-CM | POA: Diagnosis not present

## 2019-02-04 DIAGNOSIS — F319 Bipolar disorder, unspecified: Secondary | ICD-10-CM | POA: Diagnosis not present

## 2019-02-08 ENCOUNTER — Encounter: Payer: Self-pay | Admitting: Student in an Organized Health Care Education/Training Program

## 2019-02-08 ENCOUNTER — Other Ambulatory Visit: Payer: Self-pay

## 2019-02-08 ENCOUNTER — Ambulatory Visit: Payer: Medicaid Other | Admitting: Student in an Organized Health Care Education/Training Program

## 2019-02-08 VITALS — BP 110/70 | HR 71 | Temp 98.6°F | Ht 68.0 in | Wt 179.0 lb

## 2019-02-08 DIAGNOSIS — Z23 Encounter for immunization: Secondary | ICD-10-CM

## 2019-02-08 DIAGNOSIS — M797 Fibromyalgia: Secondary | ICD-10-CM | POA: Diagnosis not present

## 2019-02-08 MED ORDER — GABAPENTIN 100 MG PO CAPS: 100.0000 mg | ORAL_CAPSULE | Freq: Every day | ORAL | 0 refills | Status: DC

## 2019-02-08 NOTE — Assessment & Plan Note (Signed)
Previously tried Cymbalta, Prozac.  We will try 100 mg gabapentin nightly.  Advised the patient that this can cause drowsiness.  If the symptoms improve on gabapentin, there is room to increase the dose.  -  Planning a head: Consider amitriptyline or venlafaxine as alternatives if symptoms do not improve

## 2019-02-08 NOTE — Progress Notes (Signed)
   CC: Fibromyalgia  HPI: Caitlin Hartman is a 28 y.o. female with PMH significant for  Patient Active Problem List   Diagnosis Date Noted  . IUD (intrauterine device) in place 03/01/2018  . Breast mass 11/08/2017  . Bipolar disorder (HCC) 11/08/2017  . PTSD (post-traumatic stress disorder) 11/08/2017  . Iron deficiency anemia 09/11/2017  . ANA positive 04/16/2017  . Low vitamin D level 04/16/2017  . Fibromyalgia 04/07/2017  . Asthma, mild intermittent 01/31/2017  . Thalassemia trait 08/03/2015  . 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  She reports that her pain is currently in her feet with burning when she walks.  She is previously come in for trigger point pain.  In the past she has tried Cymbalta, however it caused GI upset.  She is also tried Prozac which was prescribed by her psychiatrist, however she did not feel that this helped.  She asks whether a referral to pain management would be appropriate today.  She does not frequently exercise.  Review of Symptoms:  See HPI for ROS.   CC, SH/smoking status, and VS noted.  Objective: BP 110/70 (BP Location: Left Arm, Patient Position: Sitting, Cuff Size: Normal)   Pulse 71   Temp 98.6 F (37 C) (Oral)   Ht 5\' 8"  (1.727 m)   Wt 179 lb (81.2 kg)   SpO2 97%   BMI 27.22 kg/m  GEN: NAD, alert, cooperative, and pleasant. RESPIRATORY: Comfortable work of breathing, speaks in full sentences CV: Regular rate noted, distal extremities well perfused and warm without edema GI: Soft, nondistended SKIN: warm and dry, no rashes or lesions NEURO: II-XII grossly intact MSK: Moves 4 extremities equally PSYCH: AAOx3, appropriate affect   Assessment and plan:  Fibromyalgia Previously tried Cymbalta, Prozac.  We will try 100 mg gabapentin nightly.  Advised the patient that this can cause drowsiness.  If the symptoms improve on gabapentin, there is room to increase the dose.  -  Planning a  head: Consider amitriptyline or venlafaxine as alternatives if symptoms do not improve   Orders Placed This Encounter  Procedures  . Flu Vaccine QUAD 36+ mos IM    Meds ordered this encounter  Medications  . gabapentin (NEURONTIN) 100 MG capsule    Sig: Take 1 capsule (100 mg total) by mouth at bedtime.    Dispense:  30 capsule    Refill:  0     Howard Pouch, MD,MS,  PGY3 02/08/2019 4:37 PM

## 2019-02-08 NOTE — Patient Instructions (Signed)
It was a pleasure seeing you today in our clinic. Here is the treatment plan we have discussed and agreed upon together:  Please try the gabapentin at night.  Please schedule an appointment in 4 weeks  Our clinic's number is [phone]. Please call with questions or concerns about what we discussed today.  Be well, Dr. Mosetta Putt

## 2019-02-11 DIAGNOSIS — F319 Bipolar disorder, unspecified: Secondary | ICD-10-CM | POA: Diagnosis not present

## 2019-02-17 DIAGNOSIS — F3132 Bipolar disorder, current episode depressed, moderate: Secondary | ICD-10-CM | POA: Diagnosis not present

## 2019-02-17 DIAGNOSIS — F411 Generalized anxiety disorder: Secondary | ICD-10-CM | POA: Diagnosis not present

## 2019-02-17 DIAGNOSIS — F4312 Post-traumatic stress disorder, chronic: Secondary | ICD-10-CM | POA: Diagnosis not present

## 2019-02-18 DIAGNOSIS — F319 Bipolar disorder, unspecified: Secondary | ICD-10-CM | POA: Diagnosis not present

## 2019-02-25 DIAGNOSIS — F319 Bipolar disorder, unspecified: Secondary | ICD-10-CM | POA: Diagnosis not present

## 2019-03-04 DIAGNOSIS — F319 Bipolar disorder, unspecified: Secondary | ICD-10-CM | POA: Diagnosis not present

## 2019-03-11 DIAGNOSIS — F319 Bipolar disorder, unspecified: Secondary | ICD-10-CM | POA: Diagnosis not present

## 2019-03-25 DIAGNOSIS — F319 Bipolar disorder, unspecified: Secondary | ICD-10-CM | POA: Diagnosis not present

## 2019-04-01 DIAGNOSIS — F319 Bipolar disorder, unspecified: Secondary | ICD-10-CM | POA: Diagnosis not present

## 2019-04-08 DIAGNOSIS — F319 Bipolar disorder, unspecified: Secondary | ICD-10-CM | POA: Diagnosis not present

## 2019-04-15 DIAGNOSIS — F319 Bipolar disorder, unspecified: Secondary | ICD-10-CM | POA: Diagnosis not present

## 2019-04-22 DIAGNOSIS — F319 Bipolar disorder, unspecified: Secondary | ICD-10-CM | POA: Diagnosis not present

## 2019-04-29 DIAGNOSIS — F319 Bipolar disorder, unspecified: Secondary | ICD-10-CM | POA: Diagnosis not present

## 2019-06-10 DIAGNOSIS — F319 Bipolar disorder, unspecified: Secondary | ICD-10-CM | POA: Diagnosis not present

## 2019-06-24 DIAGNOSIS — F319 Bipolar disorder, unspecified: Secondary | ICD-10-CM | POA: Diagnosis not present

## 2019-07-01 DIAGNOSIS — F319 Bipolar disorder, unspecified: Secondary | ICD-10-CM | POA: Diagnosis not present

## 2019-07-10 ENCOUNTER — Encounter (HOSPITAL_COMMUNITY): Payer: Self-pay

## 2019-07-10 ENCOUNTER — Other Ambulatory Visit: Payer: Self-pay

## 2019-07-10 ENCOUNTER — Emergency Department (HOSPITAL_COMMUNITY)
Admission: EM | Admit: 2019-07-10 | Discharge: 2019-07-10 | Disposition: A | Discharge status: Home / Self Care | Payer: Medicaid Other | Attending: Emergency Medicine | Admitting: Emergency Medicine

## 2019-07-10 DIAGNOSIS — S169XXA Unspecified injury of muscle, fascia and tendon at neck level, initial encounter: Secondary | ICD-10-CM | POA: Diagnosis not present

## 2019-07-10 DIAGNOSIS — Y999 Unspecified external cause status: Secondary | ICD-10-CM | POA: Diagnosis not present

## 2019-07-10 DIAGNOSIS — I10 Essential (primary) hypertension: Secondary | ICD-10-CM | POA: Insufficient documentation

## 2019-07-10 DIAGNOSIS — Z91040 Latex allergy status: Secondary | ICD-10-CM | POA: Diagnosis not present

## 2019-07-10 DIAGNOSIS — Z885 Allergy status to narcotic agent status: Secondary | ICD-10-CM | POA: Insufficient documentation

## 2019-07-10 DIAGNOSIS — Y929 Unspecified place or not applicable: Secondary | ICD-10-CM | POA: Insufficient documentation

## 2019-07-10 DIAGNOSIS — J45909 Unspecified asthma, uncomplicated: Secondary | ICD-10-CM | POA: Diagnosis not present

## 2019-07-10 DIAGNOSIS — M797 Fibromyalgia: Secondary | ICD-10-CM | POA: Insufficient documentation

## 2019-07-10 DIAGNOSIS — Y9389 Activity, other specified: Secondary | ICD-10-CM | POA: Diagnosis not present

## 2019-07-10 DIAGNOSIS — Z79899 Other long term (current) drug therapy: Secondary | ICD-10-CM | POA: Insufficient documentation

## 2019-07-10 DIAGNOSIS — S161XXA Strain of muscle, fascia and tendon at neck level, initial encounter: Secondary | ICD-10-CM

## 2019-07-10 DIAGNOSIS — S199XXA Unspecified injury of neck, initial encounter: Secondary | ICD-10-CM | POA: Diagnosis present

## 2019-07-10 MED ORDER — METHOCARBAMOL 500 MG PO TABS: 500.0000 mg | ORAL_TABLET | Freq: Two times a day (BID) | ORAL | 0 refills | Status: DC

## 2019-07-10 MED ORDER — ACETAMINOPHEN 500 MG PO TABS
1000.0000 mg | ORAL_TABLET | Freq: Once | ORAL | Status: AC
  Administered 2019-07-10: 1000 mg via ORAL
  Filled 2019-07-10: qty 2

## 2019-07-10 MED ORDER — LIDOCAINE 5 % EX PTCH
1.0000 | MEDICATED_PATCH | CUTANEOUS | Status: DC
  Administered 2019-07-10: 1 via TRANSDERMAL
  Filled 2019-07-10: qty 1
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
## 2019-07-10 NOTE — ED Notes (Signed)
Patient verbalizes understanding of discharge instructions. Opportunity for questioning and answers were provided. Armband removed by staff, pt discharged from ED.  
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
## 2019-07-15 DIAGNOSIS — F319 Bipolar disorder, unspecified: Secondary | ICD-10-CM | POA: Diagnosis not present

## 2019-07-22 DIAGNOSIS — F319 Bipolar disorder, unspecified: Secondary | ICD-10-CM | POA: Diagnosis not present

## 2019-08-05 DIAGNOSIS — F319 Bipolar disorder, unspecified: Secondary | ICD-10-CM | POA: Diagnosis not present

## 2019-08-12 DIAGNOSIS — F319 Bipolar disorder, unspecified: Secondary | ICD-10-CM | POA: Diagnosis not present

## 2019-08-19 DIAGNOSIS — F319 Bipolar disorder, unspecified: Secondary | ICD-10-CM | POA: Diagnosis not present

## 2019-08-30 ENCOUNTER — Ambulatory Visit: Payer: Medicaid Other | Admitting: Obstetrics & Gynecology

## 2019-09-01 ENCOUNTER — Ambulatory Visit (INDEPENDENT_AMBULATORY_CARE_PROVIDER_SITE_OTHER): Payer: Medicaid Other | Admitting: Family Medicine

## 2019-09-01 ENCOUNTER — Other Ambulatory Visit: Payer: Self-pay

## 2019-09-01 ENCOUNTER — Encounter: Payer: Self-pay | Admitting: Family Medicine

## 2019-09-01 VITALS — BP 102/64 | HR 71 | Wt 163.0 lb

## 2019-09-01 DIAGNOSIS — J4520 Mild intermittent asthma, uncomplicated: Secondary | ICD-10-CM

## 2019-09-01 DIAGNOSIS — Z021 Encounter for pre-employment examination: Secondary | ICD-10-CM

## 2019-09-01 MED ORDER — ALBUTEROL SULFATE HFA 108 (90 BASE) MCG/ACT IN AERS: 1.0000 | INHALATION_SPRAY | RESPIRATORY_TRACT | 2 refills | Status: AC | PRN

## 2019-09-01 MED ORDER — BUDESONIDE-FORMOTEROL FUMARATE 80-4.5 MCG/ACT IN AERO: 2.0000 | INHALATION_SPRAY | Freq: Two times a day (BID) | RESPIRATORY_TRACT | 3 refills | Status: DC

## 2019-09-01 NOTE — Patient Instructions (Signed)
It was a pleasure to see you today! Thank you for choosing [facility] for your primary care. Caitlin Hartman was seen for asthma and a work note. Come back to the clinic if your albuterol use continues to be daily or you are waking up at night to use it.   Today we talked about how your albuterol use of 4-5 times per day indicates your asthma is not controlled, we have added a controller medication called Symbicort.  You should take 2 puffs of this twice per day.  This should reduce the amount of albuterol you are using.  If you are still using albuterol daily or you are waking up at night to use your albuterol, your asthma is uncontrolled and need to come back in to see [place] in a week or so.   Please bring all your medications to every doctors visit   Sign up for My Chart to have easy access to your labs results, and communication with your Primary care physician.     Please check-out at the front desk before leaving the clinic.     Best,  Dr. Sherene Sires FAMILY MEDICINE RESIDENT - PGY3 09/01/2019 3:56 PM

## 2019-09-02 DIAGNOSIS — F319 Bipolar disorder, unspecified: Secondary | ICD-10-CM | POA: Diagnosis not present

## 2019-09-02 DIAGNOSIS — Z021 Encounter for pre-employment examination: Secondary | ICD-10-CM | POA: Insufficient documentation

## 2019-09-02 NOTE — Assessment & Plan Note (Signed)
Patient with uncontrolled asthma, she feels this is worse seasonally and when she is at work and when she is at home resting.  Has been using albuterol 4 times per day per her report, no nighttime awakenings.  No episodes bad enough that she thought she needed to go to the emergency department.  Uncontrolled asthma: Per Barnett Applebaum guidelines we are adding Symbicort as a controller, we discussed that Symbicort could also be used as an emergency inhaler but patient feels comfortable with albuterol as emergency would like to keep that.  We told her to try this and if over the next week her need for albuterol does not decrease to the point where she is not using it daily that she should schedule another appointment to come in and see her primary care doctor to discuss the next step in asthma control.  Emergency precautions given

## 2019-09-02 NOTE — Progress Notes (Signed)
    Subjective:  Caitlin Hartman is a 28 y.o. female who presents to the [facility] today with a chief complaint of wanting a work note and uncontrolled asthma.   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  She does say her only concern is fibromyalgia which she says starts to hurt her if she works for more than 4 hours at a time, she says as long she is able to get a lunch break that is not a problem for her and she can recoup before starting the afternoon.    Asthma, mild intermittent Patient with uncontrolled asthma, she feels this is worse seasonally and when she is at work and when she is at home resting.  Has been using albuterol 4 times per day per her report, no nighttime awakenings.  No episodes bad enough that she thought she needed to go to the emergency department.  She has no fevers, muscle aches, sick symptoms   Objective:  Physical Exam: BP 102/64   Pulse 71   Wt 163 lb (73.9 kg)   SpO2 98%   Breastfeeding No   BMI 24.78 kg/m   Gen: NAD, conversing comfortably in full sentences, thin CV: RRR with no murmurs appreciated Pulm: NWOB, CTAB with no crackles, wheezes, or rhonchi MSK: no edema, cyanosis, or clubbing noted Skin: warm, dry Neuro: grossly normal, moves all extremities Psych: Normal affect and thought content  No results found for this or any previous visit (from the past 72 hour(s)).   Assessment/Plan:  Asthma, mild intermittent Patient with uncontrolled asthma, she feels this is worse seasonally and when she is at work and when she is at home resting.  Has been using albuterol 4 times per day per her report, no nighttime awakenings.  No episodes bad enough that she thought she needed to go to the emergency department.  Uncontrolled asthma: Per Barnett Applebaum guidelines we are adding Symbicort as a controller, we discussed that Symbicort could also be used as an  emergency inhaler but patient feels comfortable with albuterol as emergency would like to keep that.  We told her to try this and if over the next week her need for albuterol does not decrease to the point where she is not using it daily that she should schedule another appointment to come in and see her primary care doctor to discuss the next step in asthma control.  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  She does say her only concern is fibromyalgia which she says starts to hurt her if she works for more than 4 hours at a time, she says as long she is able to get a lunch break that is not a problem for her and she can recoup before starting the afternoon.    Work note written that patient should have no significant barriers to being able to work and normal schedule with lunch break.   Sherene Sires, DO FAMILY MEDICINE RESIDENT - PGY3 09/02/2019 7:18 AM

## 2019-09-02 NOTE — Assessment & Plan Note (Signed)
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  She does say her only concern is fibromyalgia which she says starts to hurt her if she works for more than 4 hours at a time, she says as long she is able to get a lunch break that is not a problem for her and she can recoup before starting the afternoon.    Work note written that patient should have no significant barriers to being able to work and normal schedule with lunch break.

## 2019-09-16 DIAGNOSIS — F319 Bipolar disorder, unspecified: Secondary | ICD-10-CM | POA: Diagnosis not present

## 2019-09-21 ENCOUNTER — Ambulatory Visit: Payer: Medicaid Other | Admitting: Obstetrics and Gynecology

## 2019-10-14 DIAGNOSIS — F319 Bipolar disorder, unspecified: Secondary | ICD-10-CM | POA: Diagnosis not present

## 2019-10-20 DIAGNOSIS — F319 Bipolar disorder, unspecified: Secondary | ICD-10-CM | POA: Diagnosis not present

## 2019-10-28 DIAGNOSIS — F319 Bipolar disorder, unspecified: Secondary | ICD-10-CM | POA: Diagnosis not present

## 2019-11-10 ENCOUNTER — Ambulatory Visit: Payer: Medicaid Other | Admitting: Obstetrics and Gynecology

## 2019-11-11 ENCOUNTER — Encounter (HOSPITAL_COMMUNITY): Payer: Self-pay | Admitting: Emergency Medicine

## 2019-11-11 ENCOUNTER — Telehealth: Payer: Self-pay | Admitting: Family Medicine

## 2019-11-11 ENCOUNTER — Other Ambulatory Visit: Payer: Self-pay

## 2019-11-11 ENCOUNTER — Emergency Department (HOSPITAL_COMMUNITY): Payer: Medicaid Other

## 2019-11-11 ENCOUNTER — Emergency Department (HOSPITAL_COMMUNITY)
Admission: EM | Admit: 2019-11-11 | Discharge: 2019-11-11 | Disposition: A | Discharge status: Home / Self Care | Payer: Medicaid Other | Attending: Emergency Medicine | Admitting: Emergency Medicine

## 2019-11-11 DIAGNOSIS — Y999 Unspecified external cause status: Secondary | ICD-10-CM | POA: Insufficient documentation

## 2019-11-11 DIAGNOSIS — Z91040 Latex allergy status: Secondary | ICD-10-CM | POA: Insufficient documentation

## 2019-11-11 DIAGNOSIS — Z20828 Contact with and (suspected) exposure to other viral communicable diseases: Secondary | ICD-10-CM | POA: Diagnosis not present

## 2019-11-11 DIAGNOSIS — Y92512 Supermarket, store or market as the place of occurrence of the external cause: Secondary | ICD-10-CM | POA: Diagnosis not present

## 2019-11-11 DIAGNOSIS — W230XXA Caught, crushed, jammed, or pinched between moving objects, initial encounter: Secondary | ICD-10-CM | POA: Diagnosis not present

## 2019-11-11 DIAGNOSIS — Y9389 Activity, other specified: Secondary | ICD-10-CM | POA: Diagnosis not present

## 2019-11-11 DIAGNOSIS — Z79899 Other long term (current) drug therapy: Secondary | ICD-10-CM | POA: Insufficient documentation

## 2019-11-11 DIAGNOSIS — I10 Essential (primary) hypertension: Secondary | ICD-10-CM | POA: Insufficient documentation

## 2019-11-11 DIAGNOSIS — J45909 Unspecified asthma, uncomplicated: Secondary | ICD-10-CM | POA: Insufficient documentation

## 2019-11-11 DIAGNOSIS — S99922A Unspecified injury of left foot, initial encounter: Secondary | ICD-10-CM

## 2019-11-11 DIAGNOSIS — Z03818 Encounter for observation for suspected exposure to other biological agents ruled out: Secondary | ICD-10-CM | POA: Diagnosis not present

## 2019-11-11 DIAGNOSIS — S97122A Crushing injury of left lesser toe(s), initial encounter: Secondary | ICD-10-CM | POA: Insufficient documentation

## 2019-11-11 MED ORDER — NAPROXEN 500 MG PO TABS: 500.0000 mg | ORAL_TABLET | Freq: Two times a day (BID) | ORAL | 0 refills | Status: DC
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
## 2019-11-11 NOTE — Telephone Encounter (Signed)
Patient is asking about rhematology and pulmonogy referrals that she has been needing.  Please call her back asap at [phone].

## 2019-11-11 NOTE — ED Notes (Signed)
Patient verbalizes understanding of discharge instructions. Opportunity for questioning and answers were provided. Pt discharged from ED. 
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
## 2019-11-11 NOTE — ED Provider Notes (Signed)
[facility] Provider Note   CSN: [id number] Arrival date & time: 11/11/19  1034     History   Chief Complaint Chief Complaint  Patient presents with  . Foot Pain    HPI Caitlin Hartman is a 28 y.o. female with a history of anemia, asthma, hypertension, & fibromyalgia wo presents to the ED with complaints of  L foot injury 1.5 weeks prior. Patient states she was at [employer] when her L foot was accidentally run over by a shopping cart. Since injury she has had pain primarily to the 4th toe with associated swelling. Worse with movement. No alleviating factors. No intervention PTA. Denies numbness, tingling, or weakness. 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 Denies fever, chills, URI sxs, cough, dyspnea, or generalized body aches.     HPI  Past Medical History:  Diagnosis Date  . Anemia   . Asthma   . Dyspnea   . Fibromyalgia   . Headache   . Hypertension   . Lyme disease 2013  . Mental disorder   . 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 Vaginal Pap smear, abnormal     Patient Active Problem List   Diagnosis Date Noted  . Need for history and physical examination for employment 09/02/2019  . IUD (intrauterine device) in place 03/01/2018  . Breast mass 11/08/2017  . Bipolar disorder (HCC) 11/08/2017  . PTSD (post-traumatic stress disorder) 11/08/2017  . Iron deficiency anemia 09/11/2017  . ANA positive 04/16/2017  . Low vitamin D level 04/16/2017  . Fibromyalgia 04/07/2017  . Asthma, mild intermittent 01/31/2017  . Thalassemia trait 08/03/2015  . 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 INDUCED ABORTION     x 3   . TONSILLECTOMY    . WISDOM TOOTH EXTRACTION       OB History    Gravida  7   Para  4   Term  4   Preterm  0   AB  3   Living  4     SAB      TAB      Ectopic      Multiple  0   Live Births  1            Home Medications    Prior to Admission medications   Medication Sig Start Date End Date Taking? Authorizing Provider  albuterol (VENTOLIN HFA) 108 (90 Base) MCG/ACT inhaler Inhale 1-2 puffs into the lungs every 4 (four) hours as needed for wheezing or shortness of breath. 09/01/19 08/31/20  Marthenia RollingBland, Scott, DO  budesonide-formoterol (SYMBICORT) 80-4.5 MCG/ACT inhaler Inhale 2 puffs into the lungs 2 (two) times daily. 09/01/19   Marthenia RollingBland, Scott, DO  Cholecalciferol (VITAMIN D) 2000 units CAPS Take 1 capsule (2,000 Units total) by mouth daily. 02/26/18   Beaulah DinningGambino, Christina M, MD  fluticasone (FLONASE) 50 MCG/ACT nasal spray Place 1 spray into both nostrils daily. 12/30/18   Giovanna Kemmerer R, PA-C  gabapentin (NEURONTIN) 100 MG capsule Take 1 capsule (100 mg total) by mouth at bedtime. 02/08/19   Howard PouchFeng, Lauren, MD  ibuprofen (ADVIL,MOTRIN) 600 MG tablet Take 1 tablet (600 mg total) by mouth every 6 (six) hours. 04/29/18   Beaulah DinningGambino, Christina M, MD  methocarbamol (ROBAXIN) 500 MG tablet Take 1 tablet (500 mg total) by mouth  2 (two) times daily. 07/10/19   Langston Masker B, PA-C  naproxen (NAPROSYN) 500 MG tablet Take 1 tablet (500 mg total) by mouth 2 (two) times daily. 12/30/18   Zarea Diesing, Glynda Jaeger, PA-C  Propylene Glycol 0.95 % SOLN Apply 1-2 drops to eye 3 (three) times daily as needed. 03/11/18   Rogue Bussing, MD  tinidazole [facility]) 500 MG tablet Take 4 tablets (2,000 mg total) by mouth daily with breakfast. For two days 09/18/18   Lajean Manes, CNM    Family History Family History  Problem Relation Age of Onset  . Alcohol abuse Mother   . Drug abuse Mother   . Depression Mother   . Mental illness Mother   . Drug abuse Father   . Cancer Maternal Aunt   . Breast cancer Maternal Aunt   . Diabetes Maternal Uncle   . Alcohol abuse Maternal Grandmother   . Cancer Maternal Grandmother   . Diabetes  Maternal Grandmother   . Varicose Veins Maternal Grandmother   . Drug abuse Maternal Grandfather   . Cancer Paternal Grandmother   . Breast cancer Paternal Grandmother     Social History Social History   Tobacco Use  . Smoking status: Never Smoker  . Smokeless tobacco: Never Used  Substance Use Topics  . Alcohol use: No  . Drug use: Yes    Frequency: 3.0 times per week    Types: Marijuana    Comment: pt reports using marijuana x3/ wk for her fibromyalgia     Allergies   Latex and Oxycodone-acetaminophen   Review of Systems Review of Systems  Constitutional: Negative for chills and fever.  HENT: Negative for congestion, ear pain and sore throat.   Respiratory: Negative for shortness of breath.   Cardiovascular: Negative for chest pain.  Gastrointestinal: Negative for nausea and vomiting.  Musculoskeletal: Positive for arthralgias and joint swelling. Negative for myalgias (generalized).  Neurological: Negative for weakness and numbness.     Physical Exam Updated Vital Signs BP (!) 129/91 (BP Location: Right Arm)   Pulse 76   Temp 98.4 F (36.9 C) (Oral)   Resp 14   LMP 11/10/2019   SpO2 100%   Physical Exam Vitals signs and nursing note reviewed.  Constitutional:      General: She is not in acute distress.    Appearance: She is not ill-appearing or toxic-appearing.  HENT:     Head: Normocephalic and atraumatic.  Cardiovascular:     Pulses:          Dorsalis pedis pulses are 2+ on the right side and 2+ on the left side.       Posterior tibial pulses are 2+ on the right side and 2+ on the left side.  Pulmonary:     Effort: Pulmonary effort is normal.  Musculoskeletal:     Comments: Lower extremities: No obvious deformity, appreciable swelling, edema, erythema, ecchymosis, warmth, or open wounds. Patient has intact AROM to bilateral hips, knees, ankles, and all digits. Tender to palpation over the left 4th phalanges, IP joints, MTP, & metatarsal. LEs are  otherwise nontender. No tenderness to the base of the 5th metatarsal or to the navicular bone.   Skin:    General: Skin is warm and dry.     Capillary Refill: Capillary refill takes less than 2 seconds.  Neurological:     Mental Status: She is alert.     Comments: Alert. Clear speech. Sensation grossly intact to bilateral lower extremities. 5/5 strength with  plantar/dorsiflexion bilaterally. Patient ambulatory.  Psychiatric:        Mood and Affect: Mood normal.        Behavior: Behavior normal.      ED Treatments / Results  Labs (all labs ordered are listed, but only abnormal results are displayed) Labs Reviewed  NOVEL CORONAVIRUS, NAA (HOSP ORDER, SEND-OUT TO REF LAB; TAT 18-24 HRS)    EKG None  Radiology Dg Foot Complete Left  Result Date: 11/11/2019 CLINICAL DATA:  Rolled shopping cart over left foot yesterday. EXAM: LEFT FOOT - COMPLETE 3+ VIEW COMPARISON:  None. FINDINGS: There is no evidence of fracture or dislocation. There is no evidence of arthropathy or other focal bone abnormality. Soft tissues are unremarkable. IMPRESSION: Negative. Electronically Signed   By: Amie Portland M.D.   On: 11/11/2019 11:55    Procedures Procedures (including critical care time)  Medications Ordered in ED Medications - No data to display   Initial Impression / Assessment and Plan / ED Course  I have reviewed the triage vital signs and the nursing notes.  Pertinent labs & imaging results that were available during my care of the patient were reviewed by me and considered in my medical decision making (see chart for details).   Patient presents to the ED with complaints of L4th toe/foot pain s/p injury 1.5 weeks ago. Nontoxic appearing, resting comfortably, vitals WNL with the exception of elevated diastolic BP- doubt HTN emergency. No open wounds. No signs of infection. ROM intact. Xray w/o fx/dislocation. NVI distally. Buddy tape, prescription for naproxen. Asymptomatic covid testing  sent- discussed virus and quarantine information as well as signs to monitor for. I discussed results, treatment plan, need for follow-up, and return precautions with the patient. Provided opportunity for questions, patient confirmed understanding and is in agreement with plan.   Final Clinical Impressions(s) / ED Diagnoses   Final diagnoses:  Injury of toe on left foot, initial encounter    ED Discharge Orders         Ordered    naproxen (NAPROSYN) 500 MG tablet  2 times daily     11/11/19 [address] PA-C 11/11/19 1219    Gerhard Munch, MD 11/12/19 [phone]

## 2019-11-11 NOTE — ED Notes (Signed)
Patient transported to X-ray 

## 2019-11-12 DIAGNOSIS — M79675 Pain in left toe(s): Secondary | ICD-10-CM | POA: Diagnosis not present

## 2019-11-12 LAB — NOVEL CORONAVIRUS, NAA (HOSP ORDER, SEND-OUT TO REF LAB; TAT 18-24 HRS): SARS-CoV-2, NAA: NOT DETECTED

## 2019-11-15 NOTE — Telephone Encounter (Signed)
Pt is wanting referrals to see a Rheumatologist and be seen by Pulmonary doctor. jw

## 2019-11-15 NOTE — Telephone Encounter (Signed)
Since I have not yet met Caitlin Hartman, I would like to evaluate her asthma and fibromyalgia before sending these referrals.  Please make an appointment for her to see me.  Thank you!

## 2019-11-18 DIAGNOSIS — F319 Bipolar disorder, unspecified: Secondary | ICD-10-CM | POA: Diagnosis not present

## 2019-11-22 NOTE — Telephone Encounter (Signed)
Contacted pt and appointment scheduled.Caitlin Hartman, CMA  

## 2019-11-24 ENCOUNTER — Encounter: Payer: Self-pay | Admitting: Family Medicine

## 2019-11-24 ENCOUNTER — Other Ambulatory Visit: Payer: Self-pay

## 2019-11-24 ENCOUNTER — Ambulatory Visit (INDEPENDENT_AMBULATORY_CARE_PROVIDER_SITE_OTHER): Payer: Medicaid Other | Admitting: Family Medicine

## 2019-11-24 VITALS — BP 122/80 | HR 90 | Wt 178.0 lb

## 2019-11-24 DIAGNOSIS — J4540 Moderate persistent asthma, uncomplicated: Secondary | ICD-10-CM

## 2019-11-24 DIAGNOSIS — M797 Fibromyalgia: Secondary | ICD-10-CM

## 2019-11-24 MED ORDER — AMITRIPTYLINE HCL 10 MG PO TABS: 10.0000 mg | ORAL_TABLET | Freq: Every day | ORAL | 2 refills | Status: DC

## 2019-11-24 NOTE — Patient Instructions (Signed)
It was nice meeting you today Ms. Hiltunen!  I am sending a referral for pulmonology today.  We will keep your asthma medications the same for now, but if your breathing worsens, please let me know.  You can take the albuterol more frequently if you need it since this medication is very safe.  Our goal is for you to be able to be as active as you need to be during the day without shortness of breath.  We are starting a medication called amitriptyline for your fibromyalgia.  Please take this every night before bedtime.  Please let me know if you have any difficulties with this medication.  Daily activity has been shown to be very beneficial in people with fibromyalgia, so I recommend walking every day or doing another activity that you enjoy.  If you have any questions or concerns, please feel free to call the clinic.   Be well,  Dr. Shan Levans

## 2019-11-24 NOTE — Progress Notes (Signed)
   Subjective:    Caitlin Hartman - 28 y.o. female MRN [id number]  Date of birth: 02/26/1991  CC:  Caitlin Hartman is here for asthma and fibromyalgia.  HPI:  Asthma Using symbicort twice daily and albuterol about three times per week Feels like she needs to use albuterol more than she does but limits herself because she does not want to take more medicine Works at [employer], which is quite an active job, and she is often out of breath when she performs her job  Fibromyalgia Started six years ago after she did not complete the antibiotics for Lyme disease Sometimes pain is greater than a 10 in intensity, although other times it is well controlled Mostly affects her back, legs, and feet Disrupts her quality of life about 5 days out of each week Exacerbating factors include weather changes, repetitive movements.  Did not take gabapentin when it was prescribed to her recently because she wanted to avoid medications Alleviating factors include resting after repetitive movements  Health Maintenance:  There are no preventive care reminders to display for this patient.  -  reports that she has never smoked. She has never used smokeless tobacco. - Review of Systems: Per HPI. - Past Medical History: Patient Active Problem List   Diagnosis Date Noted  . Need for history and physical examination for employment 09/02/2019  . IUD (intrauterine device) in place 03/01/2018  . Breast mass 11/08/2017  . Bipolar disorder ([place]) 11/08/2017  . PTSD (post-traumatic stress disorder) 11/08/2017  . Iron deficiency anemia 09/11/2017  . ANA positive 04/16/2017  . Low vitamin D level 04/16/2017  . Fibromyalgia 04/07/2017  . Asthma, moderate persistent 01/31/2017  . Thalassemia trait 08/03/2015  . 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   - Medications: reviewed and updated   Objective:   Physical Exam BP 122/80   Pulse 90   Wt 178 lb (80.7 kg)   LMP 11/10/2019   SpO2 97%   BMI 27.06 kg/m  Gen:  NAD, alert, cooperative with exam, well-appearing CV: RRR, good S1/S2, no murmur, no edema Resp: CTABL, no wheezes, non-labored on room air Musculoskeletal: No tenderness along the paraspinal muscles of her back, normal gait Psych: good insight, alert and oriented        Assessment & Plan:   Asthma, moderate persistent Encouraged patient to use her albuterol as needed rather than rationing this medication since its side effect profile was very mild.  Patient does not wish to change her medications right now, so we will keep them the same and refer her to pulmonology for further assessment and treatment.  Fibromyalgia Counseled patient that the best intervention from fibromyalgia is a combination of daily activity and medication if needed.  Since her asthma and habits her activity level, she is interested in starting a medication.  It appears that she has not tried amitriptyline before according to chart review and patient history, so we will start amitriptyline 10 mg nightly.  The side effects of this medication were reviewed with the patient.  She was also told that fibromyalgia can usually be managed in the primary clinic and she does not need a rheumatology referral for this.    Maia Breslow, M.D. 11/25/2019, 11:20 AM PGY-3, [place]

## 2019-11-25 DIAGNOSIS — F319 Bipolar disorder, unspecified: Secondary | ICD-10-CM | POA: Diagnosis not present

## 2019-11-25 NOTE — Assessment & Plan Note (Addendum)
Counseled patient that the best intervention from fibromyalgia is a combination of daily activity and medication if needed.  Since her asthma and habits her activity level, she is interested in starting a medication.  It appears that she has not tried amitriptyline before according to chart review and patient history, so we will start amitriptyline 10 mg nightly.  The side effects of this medication were reviewed with the patient.  She was also told that fibromyalgia can usually be managed in the primary clinic and she does not need a rheumatology referral for this.

## 2019-11-25 NOTE — Assessment & Plan Note (Signed)
Encouraged patient to use her albuterol as needed rather than rationing this medication since its side effect profile was very mild.  Patient does not wish to change her medications right now, so we will keep them the same and refer her to pulmonology for further assessment and treatment.

## 2019-12-11 ENCOUNTER — Other Ambulatory Visit: Payer: Self-pay

## 2019-12-11 ENCOUNTER — Encounter (HOSPITAL_COMMUNITY): Payer: Self-pay | Admitting: Emergency Medicine

## 2019-12-11 ENCOUNTER — Emergency Department (HOSPITAL_COMMUNITY)
Admission: EM | Admit: 2019-12-11 | Discharge: 2019-12-11 | Disposition: A | Discharge status: Home / Self Care | Payer: Medicaid Other | Attending: Emergency Medicine | Admitting: Emergency Medicine

## 2019-12-11 DIAGNOSIS — I10 Essential (primary) hypertension: Secondary | ICD-10-CM | POA: Diagnosis not present

## 2019-12-11 DIAGNOSIS — U071 COVID-19: Secondary | ICD-10-CM | POA: Insufficient documentation

## 2019-12-11 DIAGNOSIS — J45909 Unspecified asthma, uncomplicated: Secondary | ICD-10-CM | POA: Insufficient documentation

## 2019-12-11 DIAGNOSIS — Z79899 Other long term (current) drug therapy: Secondary | ICD-10-CM | POA: Insufficient documentation

## 2019-12-11 DIAGNOSIS — Z20822 Contact with and (suspected) exposure to covid-19: Secondary | ICD-10-CM

## 2019-12-11 DIAGNOSIS — R509 Fever, unspecified: Secondary | ICD-10-CM | POA: Diagnosis present

## 2019-12-11 LAB — INFLUENZA PANEL BY PCR (TYPE A & B)
Influenza A By PCR: NEGATIVE
Influenza B By PCR: NEGATIVE

## 2019-12-11 NOTE — Discharge Instructions (Addendum)
Person Under Monitoring Name: Caitlin Hartman  Location: [address] [place] [address]   Infection Prevention Recommendations for Individuals Confirmed to have, or Being Evaluated for, 2019 Novel Coronavirus (COVID-19) Infection Who Receive Care at Home  Individuals who are confirmed to have, or are being evaluated for, COVID-19 should follow the prevention steps below until a healthcare provider or local or state health department says they can return to normal activities.  Stay home except to get medical care You should restrict activities outside your home, except for getting medical care. Do not go to work, school, or public areas, and do not use public transportation or taxis.  Call ahead before visiting your doctor Before your medical appointment, call the healthcare provider and tell them that you have, or are being evaluated for, COVID-19 infection. This will help the healthcare provider's office take steps to keep other people from getting infected. Ask your healthcare provider to call the local or state health department.  Monitor your symptoms Seek prompt medical attention if your illness is worsening (e.g., difficulty breathing). Before going to your medical appointment, call the healthcare provider and tell them that you have, or are being evaluated for, COVID-19 infection. Ask your healthcare provider to call the local or state health department.  Wear a facemask You should wear a facemask that covers your nose and mouth when you are in the same room with other people and when you visit a healthcare provider. People who live with or visit you should also wear a facemask while they are in the same room with you.  Separate yourself from other people in your home As much as possible, you should stay in a different room from other people in your home. Also, you should use a separate bathroom, if available.  Avoid sharing household items You  should not share dishes, drinking glasses, cups, eating utensils, towels, bedding, or other items with other people in your home. After using these items, you should wash them thoroughly with soap and water.  Cover your coughs and sneezes Cover your mouth and nose with a tissue when you cough or sneeze, or you can cough or sneeze into your sleeve. Throw used tissues in a lined trash can, and immediately wash your hands with soap and water for at least 20 seconds or use an alcohol-based hand rub.  Wash your [employer] your hands often and thoroughly with soap and water for at least 20 seconds. You can use an alcohol-based hand sanitizer if soap and water are not available and if your hands are not visibly dirty. Avoid touching your eyes, nose, and mouth with unwashed hands.   Prevention Steps for Caregivers and Household Members of Individuals Confirmed to have, or Being Evaluated for, COVID-19 Infection Being Cared for in the Home  If you live with, or provide care at home for, a person confirmed to have, or being evaluated for, COVID-19 infection please follow these guidelines to prevent infection:  Follow healthcare provider's instructions Make sure that you understand and can help the patient follow any healthcare provider instructions for all care.  Provide for the patient's basic needs You should help the patient with basic needs in the home and provide support for getting groceries, prescriptions, and other personal needs.  Monitor the patient's symptoms If they are getting sicker, call his or her medical provider and tell them that the patient has, or is being evaluated for, COVID-19 infection. This will help the  healthcare provider's office take steps to keep other people from getting infected. Ask the healthcare provider to call the local or state health department.  Limit the number of people who have contact with the patient If possible, have only one caregiver for the  patient. Other household members should stay in another home or place of residence. If this is not possible, they should stay in another room, or be separated from the patient as much as possible. Use a separate bathroom, if available. Restrict visitors who do not have an essential need to be in the home.  Keep older adults, very young children, and other sick people away from the patient Keep older adults, very young children, and those who have compromised immune systems or chronic health conditions away from the patient. This includes people with chronic heart, lung, or kidney conditions, diabetes, and cancer.  Ensure good ventilation Make sure that shared spaces in the home have good air flow, such as from an air conditioner or an opened window, weather permitting.  Wash your hands often Wash your hands often and thoroughly with soap and water for at least 20 seconds. You can use an alcohol based hand sanitizer if soap and water are not available and if your hands are not visibly dirty. Avoid touching your eyes, nose, and mouth with unwashed hands. Use disposable paper towels to dry your hands. If not available, use dedicated cloth towels and replace them when they become wet.  Wear a facemask and gloves Wear a disposable facemask at all times in the room and gloves when you touch or have contact with the patient's blood, body fluids, and/or secretions or excretions, such as sweat, saliva, sputum, nasal mucus, vomit, urine, or feces.  Ensure the mask fits over your nose and mouth tightly, and do not touch it during use. Throw out disposable facemasks and gloves after using them. Do not reuse. Wash your hands immediately after removing your facemask and gloves. If your personal clothing becomes contaminated, carefully remove clothing and launder. Wash your hands after handling contaminated clothing. Place all used disposable facemasks, gloves, and other waste in a lined container before  disposing them with other household waste. Remove gloves and wash your hands immediately after handling these items.  Do not share dishes, glasses, or other household items with the patient Avoid sharing household items. You should not share dishes, drinking glasses, cups, eating utensils, towels, bedding, or other items with a patient who is confirmed to have, or being evaluated for, COVID-19 infection. After the person uses these items, you should wash them thoroughly with soap and water.  Wash laundry thoroughly Immediately remove and wash clothes or bedding that have blood, body fluids, and/or secretions or excretions, such as sweat, saliva, sputum, nasal mucus, vomit, urine, or feces, on them. Wear gloves when handling laundry from the patient. Read and follow directions on labels of laundry or clothing items and detergent. In general, wash and dry with the warmest temperatures recommended on the label.  Clean all areas the individual has used often Clean all touchable surfaces, such as counters, tabletops, doorknobs, bathroom fixtures, toilets, phones, keyboards, tablets, and bedside tables, every day. Also, clean any surfaces that may have blood, body fluids, and/or secretions or excretions on them. Wear gloves when cleaning surfaces the patient has come in contact with. Use a diluted bleach solution (e.g., dilute bleach with 1 part bleach and 10 parts water) or a household disinfectant with a label that says EPA-registered for coronaviruses. To  make a bleach solution at home, add 1 tablespoon of bleach to 1 quart (4 cups) of water. For a larger supply, add  cup of bleach to 1 gallon (16 cups) of water. Read labels of cleaning products and follow recommendations provided on product labels. Labels contain instructions for safe and effective use of the cleaning product including precautions you should take when applying the product, such as wearing gloves or eye protection and making sure you  have good ventilation during use of the product. Remove gloves and wash hands immediately after cleaning.  Monitor yourself for signs and symptoms of illness Caregivers and household members are considered close contacts, should monitor their health, and will be asked to limit movement outside of the home to the extent possible. Follow the monitoring steps for close contacts listed on the symptom monitoring form.   ? If you have additional questions, contact your local health department or call the epidemiologist on call at [phone] (available 24/7). ? This guidance is subject to change. For the most up-to-date guidance from [facility], please refer to their website: [employer]hu

## 2019-12-11 NOTE — ED Notes (Signed)
Patient verbalizes understanding of discharge instructions. Opportunity for questioning and answers were provided. Armband removed by staff, pt discharged from ED. Ambulated out to lobby  

## 2019-12-11 NOTE — ED Triage Notes (Signed)
Patient reports fever with chills and generalized body aches/fatigue onset yesterday , denies cough or SOB .

## 2019-12-11 NOTE — ED Provider Notes (Signed)
[facility] EMERGENCY DEPARTMENT Provider Note   CSN: [id number] Arrival date & time: 12/11/19  2979     History Chief Complaint  Patient presents with  . Fever/Chills/Body Aches    Caitlin Hartman is a 29 y.o. female presenting to the emergency department with complaint of fever and chills that began yesterday.  She states T-max of 102 F.  She is treated her symptoms with Robitussin, last dose was this morning around 6 AM.  She states she had a mild cough yesterday, however none today.  She has been in close contact with her boss who she just found out on Thursday was positive for Covid.  She denies any other symptoms including no abdominal complaints, urinary symptoms, sore throat, changes in taste or smell, nasal congestion or ear pain.  The history is provided by the patient.       Past Medical History:  Diagnosis Date  . Anemia   . Asthma   . Dyspnea   . Fibromyalgia   . Headache   . Hypertension   . Lyme disease 2013  . Mental disorder   . 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 Vaginal Pap smear, abnormal     Patient Active Problem List   Diagnosis Date Noted  . Need for history and physical examination for employment 09/02/2019  . IUD (intrauterine device) in place 03/01/2018  . Breast mass 11/08/2017  . Bipolar disorder ([place]) 11/08/2017  . PTSD (post-traumatic stress disorder) 11/08/2017  . Iron deficiency anemia 09/11/2017  . ANA positive 04/16/2017  . Low vitamin D level 04/16/2017  . Fibromyalgia 04/07/2017  . Asthma, moderate persistent 01/31/2017  . Thalassemia trait 08/03/2015  . 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 INDUCED ABORTION     x 3   . TONSILLECTOMY    . WISDOM TOOTH EXTRACTION       OB History    Gravida  7   Para  4   Term  4   Preterm  0   AB  3   Living  4     SAB      TAB      Ectopic      Multiple    0   Live Births  1           Family History  Problem Relation Age of Onset  . Alcohol abuse Mother   . Drug abuse Mother   . Depression Mother   . Mental illness Mother   . Drug abuse Father   . Cancer Maternal Aunt   . Breast cancer Maternal Aunt   . Diabetes Maternal Uncle   . Alcohol abuse Maternal Grandmother   . Cancer Maternal Grandmother   . Diabetes Maternal Grandmother   . Varicose Veins Maternal Grandmother   . Drug abuse Maternal Grandfather   . Cancer Paternal Grandmother   . Breast cancer Paternal Grandmother     Social History   Tobacco Use  . Smoking status: Never Smoker  . Smokeless tobacco: Never Used  Substance Use Topics  . Alcohol use: No  . Drug use: Yes    Frequency: 3.0 times per week    Types: Marijuana    Comment: pt reports using marijuana x3/ wk for her fibromyalgia    Home Medications Prior to Admission medications   Medication Sig Start Date End Date Taking? Authorizing  Provider  albuterol (VENTOLIN HFA) 108 (90 Base) MCG/ACT inhaler Inhale 1-2 puffs into the lungs every 4 (four) hours as needed for wheezing or shortness of breath. 09/01/19 08/31/20  Marthenia Rolling, DO  amitriptyline (ELAVIL) 10 MG tablet Take 1 tablet (10 mg total) by mouth at bedtime. 11/24/19   Lennox Solders, MD  budesonide-formoterol (SYMBICORT) 80-4.5 MCG/ACT inhaler Inhale 2 puffs into the lungs 2 (two) times daily. 09/01/19   Marthenia Rolling, DO  Cholecalciferol (VITAMIN D) 2000 units CAPS Take 1 capsule (2,000 Units total) by mouth daily. 02/26/18   Beaulah Dinning, MD  fluticasone (FLONASE) 50 MCG/ACT nasal spray Place 1 spray into both nostrils daily. 12/30/18   Petrucelli, Samantha R, PA-C  gabapentin (NEURONTIN) 100 MG capsule Take 1 capsule (100 mg total) by mouth at bedtime. 02/08/19   Howard Pouch, MD  ibuprofen (ADVIL,MOTRIN) 600 MG tablet Take 1 tablet (600 mg total) by mouth every 6 (six) hours. 04/29/18   Beaulah Dinning, MD  methocarbamol (ROBAXIN)  500 MG tablet Take 1 tablet (500 mg total) by mouth 2 (two) times daily. 07/10/19   Aviva Kluver B, PA-C  naproxen (NAPROSYN) 500 MG tablet Take 1 tablet (500 mg total) by mouth 2 (two) times daily. 11/11/19   Petrucelli, Samantha R, PA-C  Propylene Glycol 0.95 % SOLN Apply 1-2 drops to eye 3 (three) times daily as needed. 03/11/18   Casey Burkitt, MD  tinidazole [facility]) 500 MG tablet Take 4 tablets (2,000 mg total) by mouth daily with breakfast. For two days 09/18/18   Sharyon Cable, CNM    Allergies    Latex and Oxycodone-acetaminophen  Review of Systems   Review of Systems  Constitutional: Positive for chills and fever.  All other systems reviewed and are negative.   Physical Exam Updated Vital Signs BP 113/76 (BP Location: Left Arm)   Pulse 83   Temp 99.8 F (37.7 C) (Oral)   Resp 16   LMP 12/09/2019   SpO2 98%   Physical Exam Vitals and nursing note reviewed.  Constitutional:      General: She is not in acute distress.    Appearance: She is well-developed. She is not ill-appearing.  HENT:     Head: Normocephalic and atraumatic.  Eyes:     Conjunctiva/sclera: Conjunctivae normal.  Cardiovascular:     Rate and Rhythm: Normal rate and regular rhythm.  Pulmonary:     Effort: Pulmonary effort is normal. No respiratory distress.     Breath sounds: Normal breath sounds.  Neurological:     Mental Status: She is alert.  Psychiatric:        Mood and Affect: Mood normal.        Behavior: Behavior normal.     ED Results / Procedures / Treatments   Labs (all labs ordered are listed, but only abnormal results are displayed) Labs Reviewed  NOVEL CORONAVIRUS, NAA (HOSP ORDER, SEND-OUT TO REF LAB; TAT 18-24 HRS)  INFLUENZA PANEL BY PCR (TYPE A & B)    EKG None  Radiology No results found.  Procedures Procedures (including critical care time)  Medications Ordered in ED Medications - No data to display  ED Course  I have reviewed the triage vital  signs and the nursing notes.  Pertinent labs & imaging results that were available during my care of the patient were reviewed by me and considered in my medical decision making (see chart for details).    MDM Rules/Calculators/A&P  Caitlin Hartman was evaluated in Emergency Department on 12/11/2019 for the symptoms described in the history of present illness. She was evaluated in the context of the global COVID-19 pandemic, which necessitated consideration that the patient might be at risk for infection with the SARS-CoV-2 virus that causes COVID-19. Institutional protocols and algorithms that pertain to the evaluation of patients at risk for COVID-19 are in a state of rapid change based on information released by regulatory bodies including the CDC and federal and state organizations. These policies and algorithms were followed during the patient's care in the ED.  Patient presenting with fever and chills that began yesterday.  She also had mild cough yesterday, however none today.  Recent close contact with her boss who she found out was Covid positive on Thursday.  No other symptoms.  She is well-appearing and in no distress.  Vital signs stable.  Low-grade temp at 99.8 F.  Lungs clear.  Will send Covid and flu swabs.  Patient instructed to self isolate and continue treating symptoms.  Safe for discharge.  Discussed results, findings, treatment and follow up. Patient advised of return precautions. Patient verbalized understanding and agreed with plan.  Final Clinical Impression(s) / ED Diagnoses Final diagnoses:  Exposure to COVID-19 virus  Fever, unspecified fever cause    Rx / DC Orders ED Discharge Orders    None       Korry Dalgleish, [place] N, PA-C 12/11/19 0912    Virgina Norfolk, DO 12/11/19 1340

## 2019-12-12 LAB — NOVEL CORONAVIRUS, NAA (HOSP ORDER, SEND-OUT TO REF LAB; TAT 18-24 HRS): SARS-CoV-2, NAA: DETECTED — AB

## 2019-12-13 ENCOUNTER — Telehealth (HOSPITAL_COMMUNITY): Payer: Self-pay

## 2019-12-27 DIAGNOSIS — F319 Bipolar disorder, unspecified: Secondary | ICD-10-CM | POA: Diagnosis not present

## 2019-12-30 DIAGNOSIS — F319 Bipolar disorder, unspecified: Secondary | ICD-10-CM | POA: Diagnosis not present

## 2020-01-10 ENCOUNTER — Telehealth (INDEPENDENT_AMBULATORY_CARE_PROVIDER_SITE_OTHER): Payer: Medicaid Other | Admitting: Family Medicine

## 2020-01-10 VITALS — Wt 183.0 lb

## 2020-01-10 DIAGNOSIS — N760 Acute vaginitis: Secondary | ICD-10-CM | POA: Diagnosis not present

## 2020-01-10 DIAGNOSIS — B9689 Other specified bacterial agents as the cause of diseases classified elsewhere: Secondary | ICD-10-CM | POA: Diagnosis not present

## 2020-01-10 MED ORDER — METRONIDAZOLE 500 MG PO TABS: 500.0000 mg | ORAL_TABLET | Freq: Two times a day (BID) | ORAL | 0 refills | Status: DC

## 2020-01-10 NOTE — Progress Notes (Signed)
[facility] Telemedicine Visit  Patient consented to have virtual visit. Method of visit: Telephone  Encounter participants: Patient: Caitlin Hartman - located at home Provider: Lennox Solders - located at [facility] Others (if applicable): none  Chief Complaint: vulvar irritation  HPI:  Two days ago, began to feel vulvar irritation Now itchy, has brown discharge Vulva is tender when she wipes No sexual activity in the past several months, had a normal period two weeks ago Currently taking amoxicillin for tooth abscess, started this last week Has had bacterial vaginosis with similar symptoms before   ROS: per HPI  Pertinent PMHx: IUD in place  Exam:  Respiratory: no shortness of breath or cough heard during conversation  Assessment/Plan:  Bacterial vaginosis History is most consistent with bacterial vaginosis, especially given recent antibiotic use.  Prescribed metronidazole 500 mg twice daily for 7 days and counseled against concurrent consumption of alcohol.  If symptoms persist despite antibiotic therapy, counseled patient to come in for a wet prep.    Time spent during visit with patient: 7 minutes

## 2020-01-11 DIAGNOSIS — B9689 Other specified bacterial agents as the cause of diseases classified elsewhere: Secondary | ICD-10-CM | POA: Insufficient documentation

## 2020-01-11 NOTE — Assessment & Plan Note (Signed)
History is most consistent with bacterial vaginosis, especially given recent antibiotic use.  Prescribed metronidazole 500 mg twice daily for 7 days and counseled against concurrent consumption of alcohol.  If symptoms persist despite antibiotic therapy, counseled patient to come in for a wet prep.

## 2020-01-13 DIAGNOSIS — F319 Bipolar disorder, unspecified: Secondary | ICD-10-CM | POA: Diagnosis not present

## 2020-01-27 DIAGNOSIS — F319 Bipolar disorder, unspecified: Secondary | ICD-10-CM | POA: Diagnosis not present

## 2020-01-27 IMAGING — DX DG ABDOMEN ACUTE W/ 1V CHEST
3 series · 3 of 3 positions shown · non-contrast
Comparison: None.

CLINICAL DATA: Mid abdominal pain, upper respiratory symptoms for 2
weeks

EXAM:
DG ABDOMEN ACUTE W/ 1V CHEST

[w chest pa (1 of 2)]
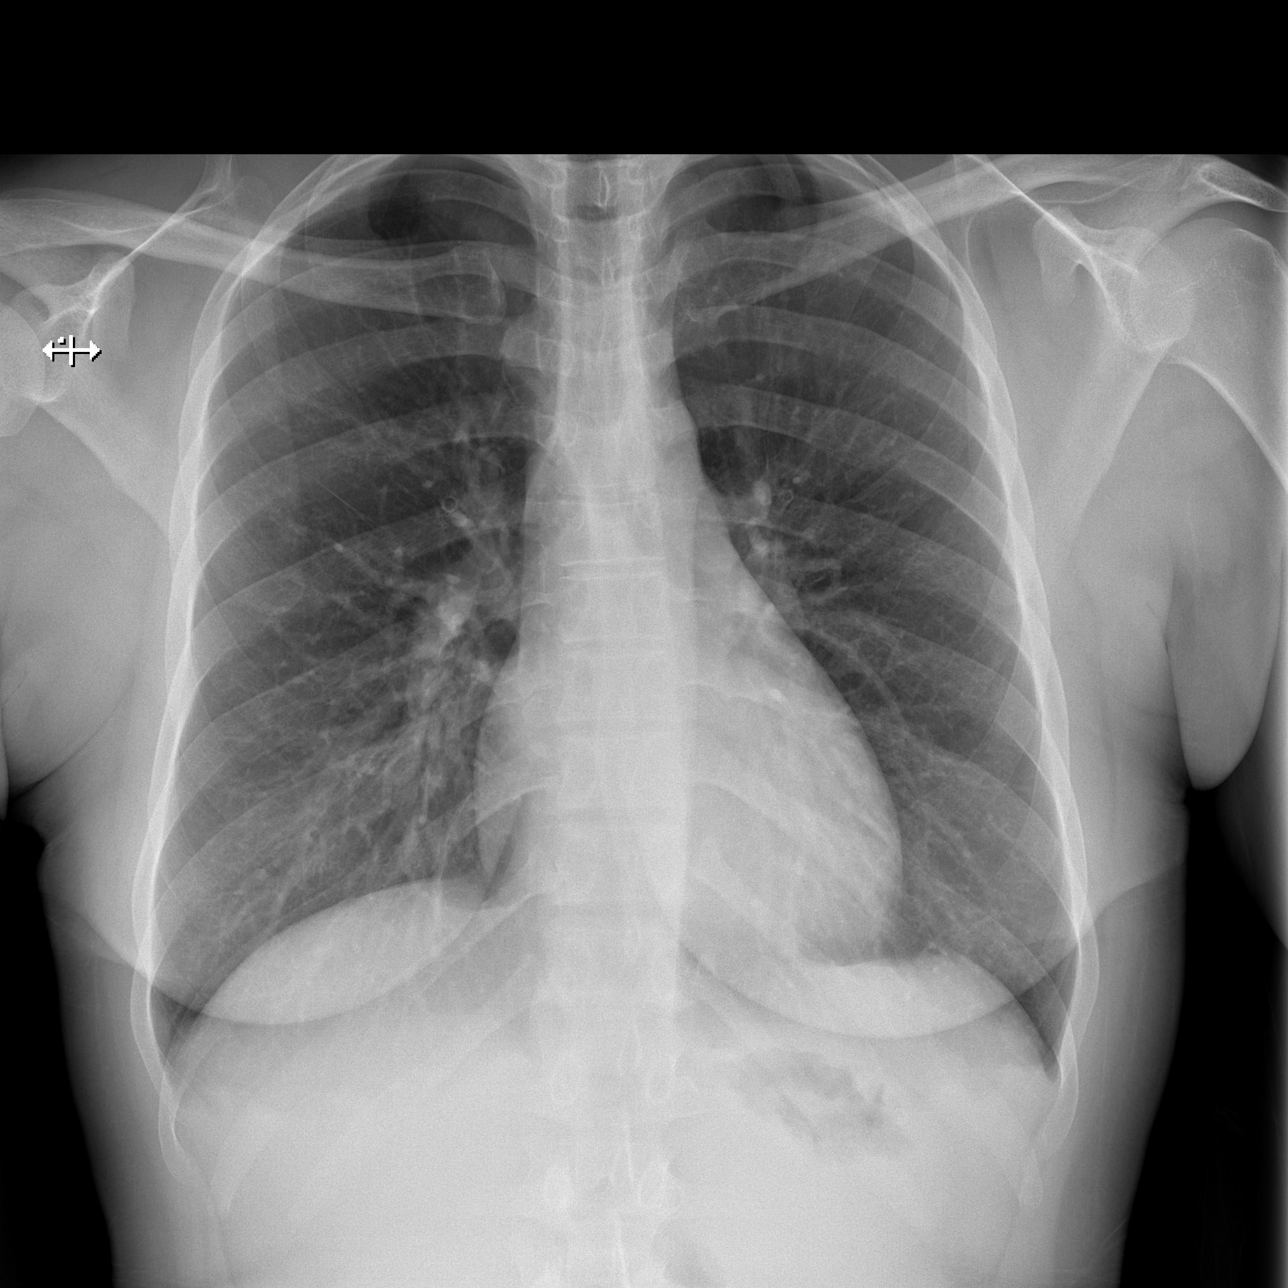

[w chest pa (2 of 2)]
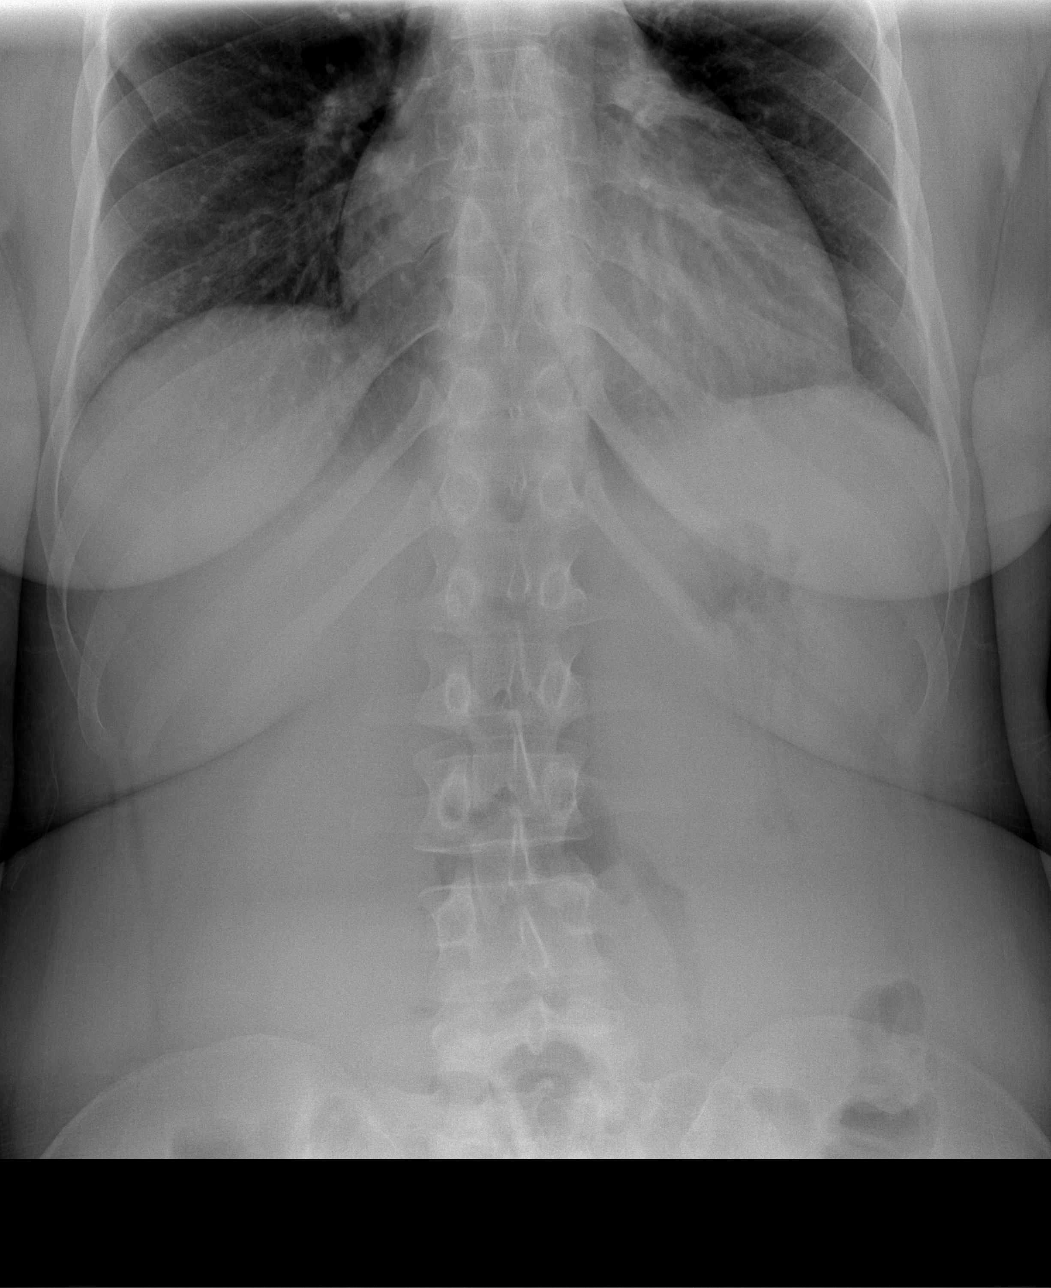

[t abdomen supine]
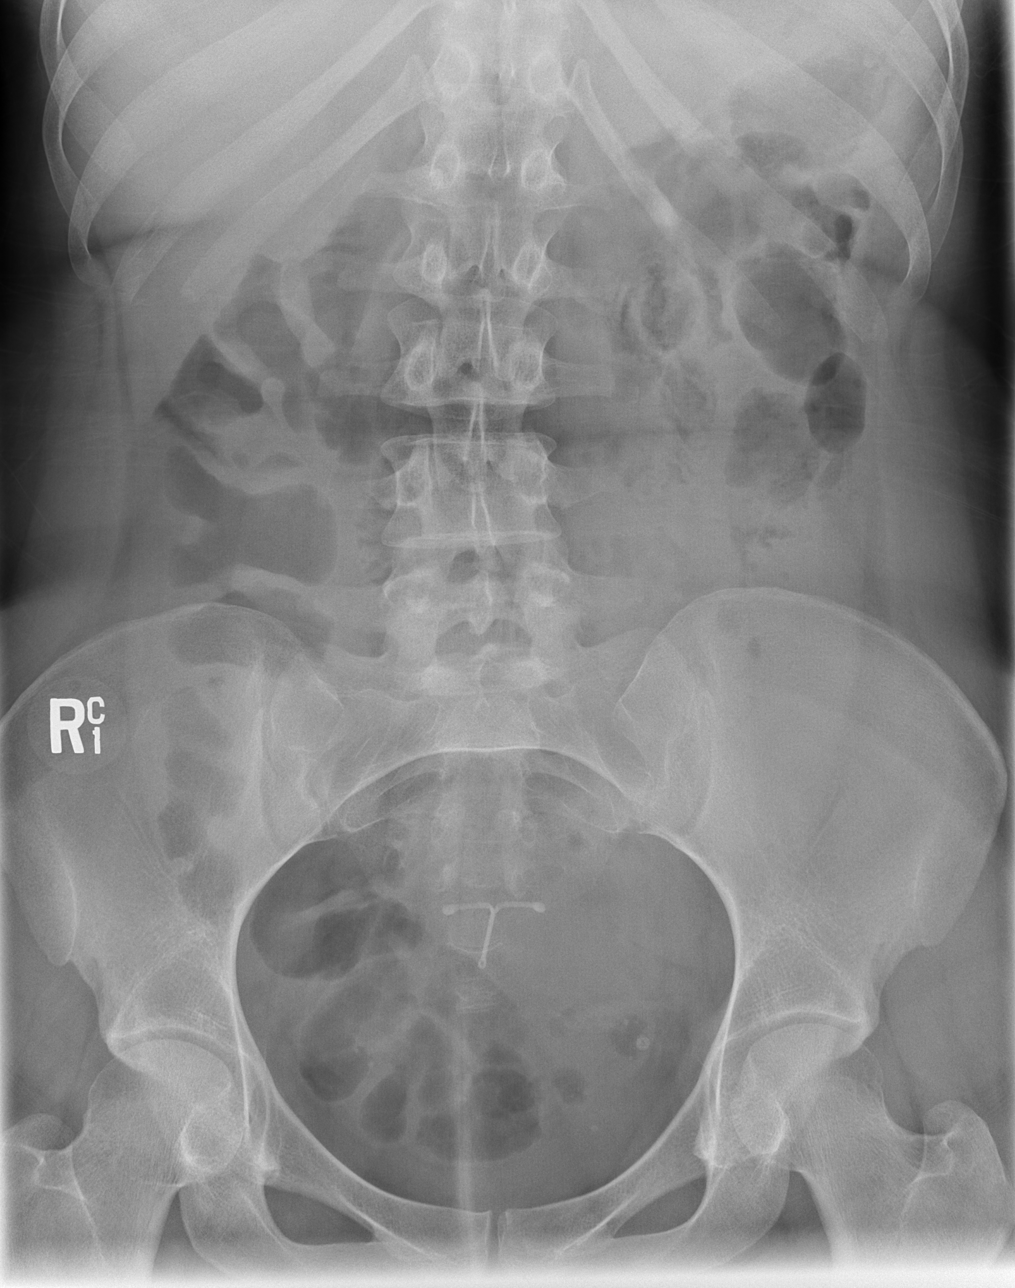

[3 of 3 positions shown; findings below may reference images not displayed]

FINDINGS: There is no evidence of dilated bowel loops or free intraperitoneal
air. No radiopaque calculi or other significant radiographic
abnormality is seen. Heart size and mediastinal contours are within
normal limits. Both lungs are clear. IUD noted in the midline of the
pelvis.
IMPRESSION: Negative abdominal radiographs.  No acute cardiopulmonary disease.

## 2020-03-22 DIAGNOSIS — F319 Bipolar disorder, unspecified: Secondary | ICD-10-CM | POA: Diagnosis not present

## 2020-03-27 DIAGNOSIS — F319 Bipolar disorder, unspecified: Secondary | ICD-10-CM | POA: Diagnosis not present

## 2020-04-06 DIAGNOSIS — F319 Bipolar disorder, unspecified: Secondary | ICD-10-CM | POA: Diagnosis not present

## 2020-04-13 DIAGNOSIS — F319 Bipolar disorder, unspecified: Secondary | ICD-10-CM | POA: Diagnosis not present

## 2020-04-20 DIAGNOSIS — F319 Bipolar disorder, unspecified: Secondary | ICD-10-CM | POA: Diagnosis not present

## 2020-05-04 DIAGNOSIS — H5213 Myopia, bilateral: Secondary | ICD-10-CM | POA: Diagnosis not present

## 2020-05-09 ENCOUNTER — Encounter: Payer: Self-pay | Admitting: Family Medicine

## 2020-05-09 ENCOUNTER — Ambulatory Visit (INDEPENDENT_AMBULATORY_CARE_PROVIDER_SITE_OTHER): Payer: Medicaid Other | Admitting: Family Medicine

## 2020-05-09 ENCOUNTER — Other Ambulatory Visit: Payer: Self-pay

## 2020-05-09 VITALS — BP 110/60 | Ht 68.0 in | Wt 221.1 lb

## 2020-05-09 DIAGNOSIS — G5603 Carpal tunnel syndrome, bilateral upper limbs: Secondary | ICD-10-CM

## 2020-05-09 DIAGNOSIS — M79642 Pain in left hand: Secondary | ICD-10-CM

## 2020-05-09 DIAGNOSIS — G5600 Carpal tunnel syndrome, unspecified upper limb: Secondary | ICD-10-CM | POA: Insufficient documentation

## 2020-05-09 DIAGNOSIS — M79641 Pain in right hand: Secondary | ICD-10-CM | POA: Diagnosis not present

## 2020-05-09 MED ORDER — FAMOTIDINE 20 MG PO TABS: 20.0000 mg | ORAL_TABLET | Freq: Two times a day (BID) | ORAL | 0 refills | Status: DC

## 2020-05-09 MED ORDER — NAPROXEN 500 MG PO TABS: 500.0000 mg | ORAL_TABLET | Freq: Two times a day (BID) | ORAL | 0 refills | Status: AC

## 2020-05-09 NOTE — Patient Instructions (Addendum)
It was a pleasure meeting you today.  You were seen for hand pain.    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 The carpal tunnel is a narrow area that is on the palm side of your wrist. Repeated wrist motion or certain diseases may cause swelling in the tunnel. This swelling can pinch the main nerve in the wrist (median nerve). What are the causes? This condition may be caused by:  Repeated wrist motions.  Wrist injuries.  Arthritis.  A sac of fluid (cyst) or abnormal growth (tumor) in the carpal tunnel.  Fluid buildup during pregnancy. Sometimes the cause is not known. What increases the risk? The following factors may make you more likely to develop this condition:  Having a job in which you move your wrist in the same way many times. This includes jobs like being a Midwife or a Conservation officer, nature.  Being a woman.  Having other health conditions, such as: ? Diabetes. ? Obesity. ? A thyroid gland that is not active enough (hypothyroidism). ? Kidney failure. What are the signs or symptoms? Symptoms of this condition include:  A tingling feeling in your fingers.  Tingling or a loss of feeling (numbness) in your hand.  Pain in your entire arm. This pain may get worse when you bend your wrist and elbow for a long time.  Pain in your wrist that goes up your arm to your shoulder.  Pain that goes down into your palm or fingers.  A weak feeling in your hands. You may find it hard to grab and hold items. You may feel worse at night. How is this diagnosed? This condition is diagnosed with a medical history and physical exam. You may also have tests, such as:  Electromyogram (EMG). This test checks the signals that the nerves send to the muscles.  Nerve conduction study. This test checks how well signals pass through your nerves.  Imaging tests, such as X-rays,  ultrasound, and MRI. These tests check for what might be the cause of your condition. How is this treated? This condition may be treated with:  Lifestyle changes. You will be asked to stop or change the activity that caused your problem.  Doing exercise and activities that make bones and muscles stronger (physical therapy).  Learning how to use your hand again (occupational therapy).  Medicines for pain and swelling (inflammation). You may have injections in your wrist.  A wrist splint.  Surgery. Follow these instructions at home: If you have a splint:  Wear the splint as told by your doctor. Remove it only as told by your doctor.  Loosen the splint if your fingers: ? Tingle. ? Lose feeling (become numb). ? Turn cold and blue.  Keep the splint clean.  If the splint is not waterproof: ? Do not let it get wet. ? Cover it with a watertight covering when you take a bath or a shower. Managing pain, stiffness, and swelling   If told, put ice on the painful area: ? If you have a removable splint, remove it as told by your doctor. ? Put ice in a plastic bag. ? Place a towel between your skin and the bag. ? Leave the ice on for 20 minutes, 2-3 times per day. General instructions  Take over-the-counter and prescription medicines only as told by your doctor.  Rest your wrist from any activity that may cause  pain. If needed, talk with your boss at work about changes that can help your wrist heal.  Do any exercises as told by your doctor, physical therapist, or occupational therapist.  Keep all follow-up visits as told by your doctor. This is important. Contact a doctor if:  You have new symptoms.  Medicine does not help your pain.  Your symptoms get worse. Get help right away if:  You have very bad numbness or tingling in your wrist or hand. Summary  Carpal tunnel syndrome is a condition that causes pain in your hand and arm.  It is often caused by repeated wrist  motions.  Lifestyle changes and medicines are used to treat this problem. Surgery may help in very bad cases.  Follow your doctor's instructions about wearing a splint, resting your wrist, keeping follow-up visits, and calling for help. This information is not intended to replace advice given to you by your health care provider. Make sure you discuss any questions you have with your health care provider. Document Revised: 04/03/2018 Document Reviewed: 04/03/2018 Elsevier Patient Education  2020 [employer]vier Inc.   Carollee Leitz, MD Family Medicine Residency

## 2020-05-09 NOTE — Progress Notes (Signed)
    SUBJECTIVE:   CHIEF COMPLAINT / HPI: hand pain, dry skin on left foot and pain in right leg when driving  Bilateral hand pain Patient reports bilateral hand pain ongoing for years.  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  Resolves when she is not performing braiding.  She has not taken anything for pain as she reports a history of fibromyalgia and therefore she just "deals with it".  The pain occurs in hands over the thenar surface.  Denies any fevers, joint pain or trauma.    Skin pealing on left foot Reports having skin that peals during the summer only.  Currently on left foot but does have on hands when breaks out. Denies any itching or pain but reports that sometimes there will be little blisters that break open and then pealing starts.      Right foot pain when driving Patient is currently not having any pain.  She states that the pain only occurs when she is driving and she has release the gas and brake peddles.  She reports the pain is anterior and initially was like shin splint but now only when flexing the foot.  Denies any edema and reports no respiratory symptoms.  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    PERTINENT  PMH / PSH:  Fibromyalgia Carpal Tunnel Asthma  OBJECTIVE:   BP 110/60   Ht 5\' 8"  (1.727 m)   Wt 221 lb 2 oz (100.3 kg)   LMP 04/29/2020   BMI 33.62 kg/m    General: Alert and oriented, no apparent distress  Hands: ROM intact, no edema or erythema, sensation normal, tender to palpate over thenar area bilaterally, no thenar atrophy.  Tinels neg, Phalens positive, pulses present bilaterally and equal. MSK: Upper extremity strength 5/5 bilaterally, Lower extremity strength 5/5 bilaterally, no lower extremity edema, pulses present, pain on flexion on dorsum of Rt foot.  Dry skin noted to lateral sideof left foot, no erythema noted.     ASSESSMENT/PLAN:   Carpal tunnel syndrome Pt reports history of  Carpal Tunnel.  Positive Phalans.  Worse with repetative movement. -Naproxen 500 mg BID x 14 days -Pepcid 20 mg BID x 14 days -Wrist splints to wear at night -Consider SM for u/s and possible injection if no improvement with NSAIDS -Consider Neurosurgery referral for EMG studies if no improvement -OTC Aquaphor ointment for dry skin -Follow up with PCP      05/01/2020, MD [facility]

## 2020-05-09 NOTE — Assessment & Plan Note (Signed)
Pt reports history of Carpal Tunnel.  Positive Phalans.  Worse with repetative movement. -Naproxen 500 mg BID x 14 days -Pepcid 20 mg BID x 14 days -Wrist splints to wear at night -Consider SM for u/s and possible injection if no improvement with NSAIDS -Consider Neurosurgery referral for EMG studies if no improvement -OTC Aquaphor ointment for dry skin -Follow up with PCP

## 2020-05-10 DIAGNOSIS — H5213 Myopia, bilateral: Secondary | ICD-10-CM | POA: Diagnosis not present

## 2020-05-11 DIAGNOSIS — F319 Bipolar disorder, unspecified: Secondary | ICD-10-CM | POA: Diagnosis not present

## 2020-05-20 ENCOUNTER — Other Ambulatory Visit: Payer: Self-pay

## 2020-05-20 ENCOUNTER — Encounter (HOSPITAL_COMMUNITY): Payer: Self-pay

## 2020-05-20 ENCOUNTER — Ambulatory Visit (HOSPITAL_COMMUNITY)
Admission: EM | Admit: 2020-05-20 | Discharge: 2020-05-20 | Disposition: A | Discharge status: Home / Self Care | Payer: Medicaid Other | Attending: Internal Medicine | Admitting: Internal Medicine

## 2020-05-20 DIAGNOSIS — N3000 Acute cystitis without hematuria: Secondary | ICD-10-CM | POA: Insufficient documentation

## 2020-05-20 DIAGNOSIS — N390 Urinary tract infection, site not specified: Secondary | ICD-10-CM

## 2020-05-20 LAB — POCT URINALYSIS DIP (DEVICE)
Bilirubin Urine: NEGATIVE
Glucose, UA: NEGATIVE mg/dL
Ketones, ur: NEGATIVE mg/dL
Nitrite: POSITIVE — AB
Protein, ur: 30 mg/dL — AB
Specific Gravity, Urine: 1.025 (ref 1.005–1.030)
Urobilinogen, UA: 0.2 mg/dL (ref 0.0–1.0)
pH: 6.5 (ref 5.0–8.0)

## 2020-05-20 MED ORDER — FLUCONAZOLE 150 MG PO TABS: 150.0000 mg | ORAL_TABLET | Freq: Every day | ORAL | 0 refills | Status: AC

## 2020-05-20 MED ORDER — SULFAMETHOXAZOLE-TRIMETHOPRIM 800-160 MG PO TABS: 1.0000 | ORAL_TABLET | Freq: Two times a day (BID) | ORAL | 0 refills | Status: AC

## 2020-05-20 NOTE — ED Triage Notes (Signed)
Pt presents with urinary urgency and feeling of bloated bladder X 1 week.

## 2020-05-20 NOTE — ED Provider Notes (Signed)
[facility] [facility]    CSN: [id number] Arrival date & time: 05/20/20  1405      History   Chief Complaint Chief Complaint  Patient presents with  . Urinary Tract Infection    HPI Caitlin Hartman is a 29 y.o. female.   Patient here c/w "UTI" x 2 days.  Admits dysuria, frequency, urgency, hematuria, lower abdominal pain.  Denies f/c, n/v/d, flank pain, vaginal d/c, vaginal bleeding.  Sexually active, 1 female partner, no condom use.  No known exposures to STDs.     Past Medical History:  Diagnosis Date  . Anemia   . Asthma   . Dyspnea   . Fibromyalgia   . Headache   . Hypertension   . Lyme disease 2013  . Mental disorder   . 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 Vaginal Pap smear, abnormal     Patient Active Problem List   Diagnosis Date Noted  . Carpal tunnel syndrome 05/09/2020  . Bacterial vaginosis 01/11/2020  . Need for history and physical examination for employment 09/02/2019  . IUD (intrauterine device) in place 03/01/2018  . Breast mass 11/08/2017  . Bipolar disorder ([place]) 11/08/2017  . PTSD (post-traumatic stress disorder) 11/08/2017  . Iron deficiency anemia 09/11/2017  . ANA positive 04/16/2017  . Low vitamin D level 04/16/2017  . Fibromyalgia 04/07/2017  . Asthma, moderate persistent 01/31/2017  . Thalassemia trait 08/03/2015  . 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 INDUCED ABORTION     x 3   . TONSILLECTOMY    . WISDOM TOOTH EXTRACTION      OB History    Gravida  7   Para  4   Term  4   Preterm  0   AB  3   Living  4     SAB      TAB      Ectopic      Multiple  0   Live Births  1            Home Medications    Prior to Admission medications   Medication Sig Start Date End Date Taking? Authorizing Provider  albuterol (VENTOLIN HFA) 108 (90 Base) MCG/ACT inhaler Inhale 1-2 puffs into the lungs every 4 (four) hours as  needed for wheezing or shortness of breath. 09/01/19 08/31/20  Sherene Sires, DO  amitriptyline (ELAVIL) 10 MG tablet Take 1 tablet (10 mg total) by mouth at bedtime. 11/24/19   Kathrene Alu, MD  budesonide-formoterol (SYMBICORT) 80-4.5 MCG/ACT inhaler Inhale 2 puffs into the lungs 2 (two) times daily. 09/01/19   Sherene Sires, DO  Cholecalciferol (VITAMIN D) 2000 units CAPS Take 1 capsule (2,000 Units total) by mouth daily. 02/26/18   Carlyle Dolly, MD  famotidine (PEPCID) 20 MG tablet Take 1 tablet (20 mg total) by mouth 2 (two) times daily for 14 days. 05/09/20 05/23/20  Carollee Leitz, MD  fluconazole (DIFLUCAN) 150 MG tablet Take 1 tablet (150 mg total) by mouth daily for 1 day. 05/20/20 05/21/20  Peri Jefferson, PA-C  fluticasone (FLONASE) 50 MCG/ACT nasal spray Place 1 spray into both nostrils daily. 12/30/18   Petrucelli, Samantha R, PA-C  gabapentin (NEURONTIN) 100 MG capsule Take 1 capsule (100 mg total) by mouth at bedtime. 02/08/19   Everrett Coombe, MD  ibuprofen (ADVIL,MOTRIN) 600 MG tablet Take 1 tablet (600 mg total) by mouth every  6 (six) hours. 04/29/18   Beaulah Dinning, MD  methocarbamol (ROBAXIN) 500 MG tablet Take 1 tablet (500 mg total) by mouth 2 (two) times daily. 07/10/19   Aviva Kluver B, PA-C  metroNIDAZOLE (FLAGYL) 500 MG tablet Take 1 tablet (500 mg total) by mouth 2 (two) times daily. 01/10/20   Lennox Solders, MD  naproxen (NAPROSYN) 500 MG tablet Take 1 tablet (500 mg total) by mouth 2 (two) times daily with a meal for 14 days. 05/09/20 05/23/20  Dana Allan, MD  Propylene Glycol 0.95 % SOLN Apply 1-2 drops to eye 3 (three) times daily as needed. 03/11/18   Casey Burkitt, MD  sulfamethoxazole-trimethoprim (BACTRIM DS) 800-160 MG tablet Take 1 tablet by mouth 2 (two) times daily for 5 days. 05/20/20 05/25/20  Evern Core, PA-C  tinidazole (TINDAMAX) 500 MG tablet Take 4 tablets (2,000 mg total) by mouth daily with breakfast. For two days 09/18/18   Sharyon Cable, CNM    Family History Family History  Problem Relation Age of Onset  . Alcohol abuse Mother   . Drug abuse Mother   . Depression Mother   . Mental illness Mother   . Drug abuse Father   . Cancer Maternal Aunt   . Breast cancer Maternal Aunt   . Diabetes Maternal Uncle   . Alcohol abuse Maternal Grandmother   . Cancer Maternal Grandmother   . Diabetes Maternal Grandmother   . Varicose Veins Maternal Grandmother   . Drug abuse Maternal Grandfather   . Cancer Paternal Grandmother   . Breast cancer Paternal Grandmother     Social History Social History   Tobacco Use  . Smoking status: Never Smoker  . Smokeless tobacco: Never Used  Vaping Use  . Vaping Use: Never used  Substance Use Topics  . Alcohol use: No  . Drug use: Yes    Frequency: 3.0 times per week    Types: Marijuana    Comment: pt reports using marijuana x3/ wk for her fibromyalgia     Allergies   Latex and Oxycodone-acetaminophen   Review of Systems Review of Systems  Constitutional: Negative for chills, fatigue and fever.  Gastrointestinal: Positive for abdominal pain. Negative for diarrhea, nausea and vomiting.  Genitourinary: Positive for difficulty urinating, dysuria, frequency, hematuria and urgency. Negative for decreased urine volume, dyspareunia, flank pain, menstrual problem, pelvic pain, vaginal bleeding and vaginal discharge.  Musculoskeletal: Negative for arthralgias, back pain and myalgias.  Skin: Negative for color change and rash.  Neurological: Negative for seizures and syncope.  Psychiatric/Behavioral: Negative for sleep disturbance. The patient is not nervous/anxious.   All other systems reviewed and are negative.    Physical Exam Triage Vital Signs ED Triage Vitals  Enc Vitals Group     BP 05/20/20 1450 128/83     Pulse Rate 05/20/20 1450 74     Resp 05/20/20 1450 18     Temp 05/20/20 1450 98.4 F (36.9 C)     Temp Source 05/20/20 1450 Oral     SpO2 05/20/20 1450  100 %     Weight --      Height --      Head Circumference --      Peak Flow --      Pain Score 05/20/20 1448 6     Pain Loc --      Pain Edu? --      Excl. in GC? --    No data found.  Updated Vital Signs BP  128/83 (BP Location: Right Arm)   Pulse 74   Temp 98.4 F (36.9 C) (Oral)   Resp 18   LMP 04/29/2020   SpO2 100%   Visual Acuity Right Eye Distance:   Left Eye Distance:   Bilateral Distance:    Right Eye Near:   Left Eye Near:    Bilateral Near:     Physical Exam Vitals and nursing note reviewed.  Constitutional:      General: She is not in acute distress.    Appearance: Normal appearance. She is well-developed. She is not ill-appearing.  HENT:     Head: Normocephalic and atraumatic.     Nose: Nose normal.  Eyes:     General: No scleral icterus.    Conjunctiva/sclera: Conjunctivae normal.     Pupils: Pupils are equal, round, and reactive to light.  Pulmonary:     Effort: Pulmonary effort is normal. No respiratory distress.  Abdominal:     General: Bowel sounds are normal.     Palpations: Abdomen is soft.     Tenderness: There is no abdominal tenderness. There is no right CVA tenderness, left CVA tenderness or guarding.  Musculoskeletal:        General: Normal range of motion.     Cervical back: Normal range of motion and neck supple. No rigidity.  Skin:    General: Skin is warm and dry.     Capillary Refill: Capillary refill takes less than 2 seconds.  Neurological:     General: No focal deficit present.     Mental Status: She is alert and oriented to person, place, and time.  Psychiatric:        Mood and Affect: Mood normal.        Behavior: Behavior normal.      UC Treatments / Results  Labs (all labs ordered are listed, but only abnormal results are displayed) Labs Reviewed  POCT URINALYSIS DIP (DEVICE) - Abnormal; Notable for the following components:      Result Value   Hgb urine dipstick MODERATE (*)    Protein, ur 30 (*)    Nitrite  POSITIVE (*)    Leukocytes,Ua LARGE (*)    All other components within normal limits    EKG   Radiology No results found.  Procedures Procedures (including critical care time)  Medications Ordered in UC Medications - No data to display  Initial Impression / Assessment and Plan / UC Course  I have reviewed the triage vital signs and the nursing notes.  Pertinent labs & imaging results that were available during my care of the patient were reviewed by me and considered in my medical decision making (see chart for details).    Will treat for UTI. Diflucan provided as patient reports she gets yeast infections with UTI Final Clinical Impressions(s) / UC Diagnoses   Final diagnoses:  Acute cystitis without hematuria     Discharge Instructions     Take medication as prescribed.     ED Prescriptions    Medication Sig Dispense Auth. Provider   sulfamethoxazole-trimethoprim (BACTRIM DS) 800-160 MG tablet Take 1 tablet by mouth 2 (two) times daily for 5 days. 10 tablet Evern Core, PA-C   fluconazole (DIFLUCAN) 150 MG tablet Take 1 tablet (150 mg total) by mouth daily for 1 day. 1 tablet Evern Core, PA-C     PDMP not reviewed this encounter.   Evern Core, PA-C 05/20/20 1520

## 2020-05-20 NOTE — Discharge Instructions (Addendum)
Take medication as prescribed.

## 2020-05-22 LAB — URINE CULTURE: Culture: >=100000 — AB

## 2020-05-25 DIAGNOSIS — F319 Bipolar disorder, unspecified: Secondary | ICD-10-CM | POA: Diagnosis not present

## 2020-06-01 DIAGNOSIS — F319 Bipolar disorder, unspecified: Secondary | ICD-10-CM | POA: Diagnosis not present

## 2020-07-06 DIAGNOSIS — F319 Bipolar disorder, unspecified: Secondary | ICD-10-CM | POA: Diagnosis not present

## 2020-08-03 DIAGNOSIS — F319 Bipolar disorder, unspecified: Secondary | ICD-10-CM | POA: Diagnosis not present

## 2020-08-17 DIAGNOSIS — F319 Bipolar disorder, unspecified: Secondary | ICD-10-CM | POA: Diagnosis not present

## 2020-08-31 DIAGNOSIS — F319 Bipolar disorder, unspecified: Secondary | ICD-10-CM | POA: Diagnosis not present

## 2020-10-13 ENCOUNTER — Other Ambulatory Visit: Payer: Self-pay

## 2020-10-13 ENCOUNTER — Ambulatory Visit (HOSPITAL_COMMUNITY)
Admission: EM | Admit: 2020-10-13 | Discharge: 2020-10-13 | Disposition: A | Discharge status: Home / Self Care | Payer: Medicaid Other

## 2020-10-16 DIAGNOSIS — F319 Bipolar disorder, unspecified: Secondary | ICD-10-CM | POA: Diagnosis not present

## 2020-10-17 ENCOUNTER — Other Ambulatory Visit: Payer: Self-pay

## 2020-10-17 ENCOUNTER — Ambulatory Visit (INDEPENDENT_AMBULATORY_CARE_PROVIDER_SITE_OTHER): Payer: Medicaid Other | Admitting: Family Medicine

## 2020-10-17 DIAGNOSIS — M797 Fibromyalgia: Secondary | ICD-10-CM

## 2020-10-17 DIAGNOSIS — M2550 Pain in unspecified joint: Secondary | ICD-10-CM

## 2020-10-17 MED ORDER — NAPROXEN 500 MG PO TABS: 500.0000 mg | ORAL_TABLET | Freq: Two times a day (BID) | ORAL | 0 refills | Status: AC

## 2020-10-17 MED ORDER — GABAPENTIN 100 MG PO CAPS: 100.0000 mg | ORAL_CAPSULE | Freq: Two times a day (BID) | ORAL | 0 refills | Status: DC

## 2020-10-17 MED ORDER — AMITRIPTYLINE HCL 10 MG PO TABS: 10.0000 mg | ORAL_TABLET | Freq: Every day | ORAL | 2 refills | Status: DC

## 2020-10-17 NOTE — Progress Notes (Signed)
    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  Since then she reports bilateral knee pain and right-sided midfoot pain.  She has a past medical history of fibromyalgia and does not take any medications for this.  She usually wakes up at 4 AM and does gentle activity and walking which helps with the fibromyalgia pain.  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 When she drives her usual car she does not usually experience the knee and foot pains.  She has tried a knee brace, ice and ibuprofen which has not helped with the pain.  Anything which alleviates the pain is smoking marijuana and this only lasts 2 hours.  Denies taking fblood thinners, fevers, recent trauma, falls, sports injuries etc. She reports limping due to the pain.  PERTINENT  PMH / PSH: Carpal tunnel syndrome, bipolar disorder, fibromyalgia, asthma  OBJECTIVE:   BP 114/68   Pulse (!) 58   Wt 223 lb (101.2 kg)   LMP 10/03/2020 (Exact Date)   SpO2 99%   BMI 33.91 kg/m    General: Alert, well-appearing, no acute distress, appears stated age Cardio: Well-perfused Pulm: Normal work of breathing Extremities: No peripheral edema MSK: Knees: No obvious erythema or edema of knee joints bilaterally, normal active and passive range of movement but tenderness on palpation tenderness over the medial joint line of left knee, negative anterior, posterior drawer and McMurray's test, normal sensation to touch  Feet: no obvious deformities, erythema or edema, tenderness on medial aspect of feet bilaterally, normal plantar and dorsiflexion, normal sensation to touch  Neuro: Cranial nerves grossly intact  ASSESSMENT/PLAN:    Fibromyalgia Fibromyalgia pain is uncontrolled. Started patient on Gabapentin 100 mg BID today. Considered amitriptyline however patient has history of bipolar disorder and known manic episodes and is not on mood stabilizer. Opted for gabapentin over amitriptyline due to risk of precipitating a manic episode.  Multiple joint pain Joints pains are likely multifactorial. Likely a combination of patient's uncontrolled fibromyalgia worsened by patient's new occupation and incorrect footwear. Examination today shows tenderness in >3 joints: on medial aspect of left knee and medial foot pain bilaterally.  No obvious traumas and no suspicion for fractures. Considered rheumatoid/osteoarthritis however less likely given patient's age, pain is acute and not characteristic of either type of arthritis. Will hold off on ordering imaging for now. 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      Towanda Octave, MD PGY-2 [facility]

## 2020-10-17 NOTE — Patient Instructions (Signed)
It was lovely to meet you today Linder.  I am sorry that the knee and foot pain has been troubling you at work.  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  I do not think you have an infection in the bones or any fractures.  I will hold off on ordering any x-rays for now.  I would recommend restarting gabapentin 100 mg twice a day and naproxen 500 mg twice a day for the pain.  You can also buy Tylenol and take 1000 mg four times a day for the pain.  Naproxen and Tylenol worked well together so take them throughout the day together.   Please also look into buying more comfortable shoes as this will help prevent foot pain.  Please also avoid knocking your knees when getting out of the truck. I have written a letter for your work requesting more comfortable working conditions.  Please follow-up with me in the next 2 to 3 weeks if your pain has not improved  Best wishes,  Dr. Allena Katz

## 2020-10-17 NOTE — Assessment & Plan Note (Signed)
Fibromyalgia pain is uncontrolled. Started patient on Gabapentin 100 mg BID today. Considered amitriptyline however patient has history of bipolar disorder and known manic episodes and is not on mood stabilizer. Opted for gabapentin over amitriptyline due to risk of precipitating a manic episode.

## 2020-10-17 NOTE — Assessment & Plan Note (Addendum)
Joints pains are likely multifactorial. Likely a combination of patient's uncontrolled fibromyalgia worsened by patient's new occupation and incorrect footwear. Examination today shows tenderness in >3 joints: on medial aspect of left knee and medial foot pain bilaterally.  No obvious traumas and no suspicion for fractures. Considered rheumatoid/osteoarthritis however less likely given patient's age, pain is acute and not characteristic of either type of arthritis. Will hold off on ordering imaging for now. 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

## 2020-11-06 DIAGNOSIS — F319 Bipolar disorder, unspecified: Secondary | ICD-10-CM | POA: Diagnosis not present

## 2020-11-13 DIAGNOSIS — F319 Bipolar disorder, unspecified: Secondary | ICD-10-CM | POA: Diagnosis not present

## 2020-12-08 IMAGING — CR DG FOOT COMPLETE 3+V*L*
3 series · 3 of 3 positions shown · non-contrast
Comparison: None.

CLINICAL DATA: Rolled shopping cart over left foot yesterday.

EXAM:
LEFT FOOT - COMPLETE 3+ VIEW

[foot ap]
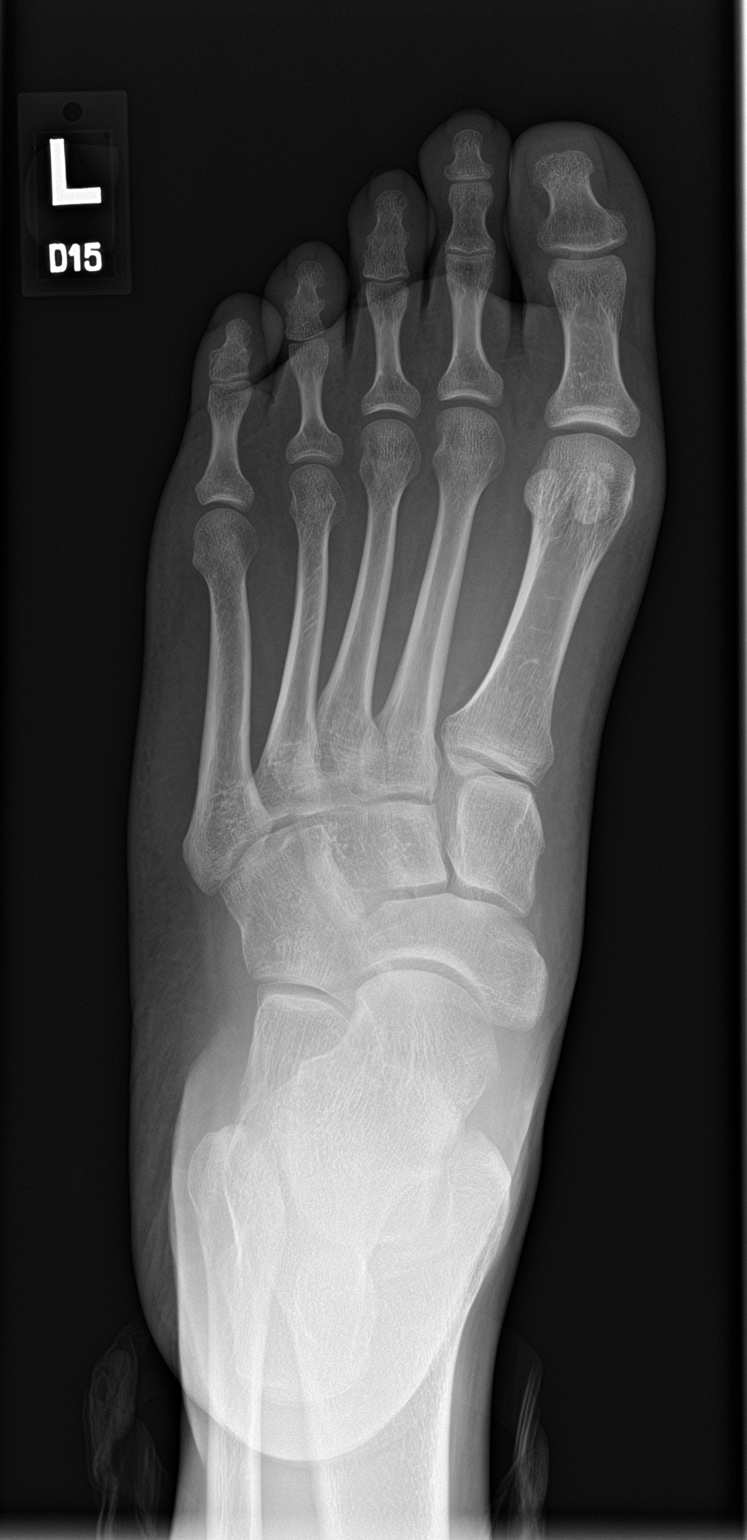

[foot obl]
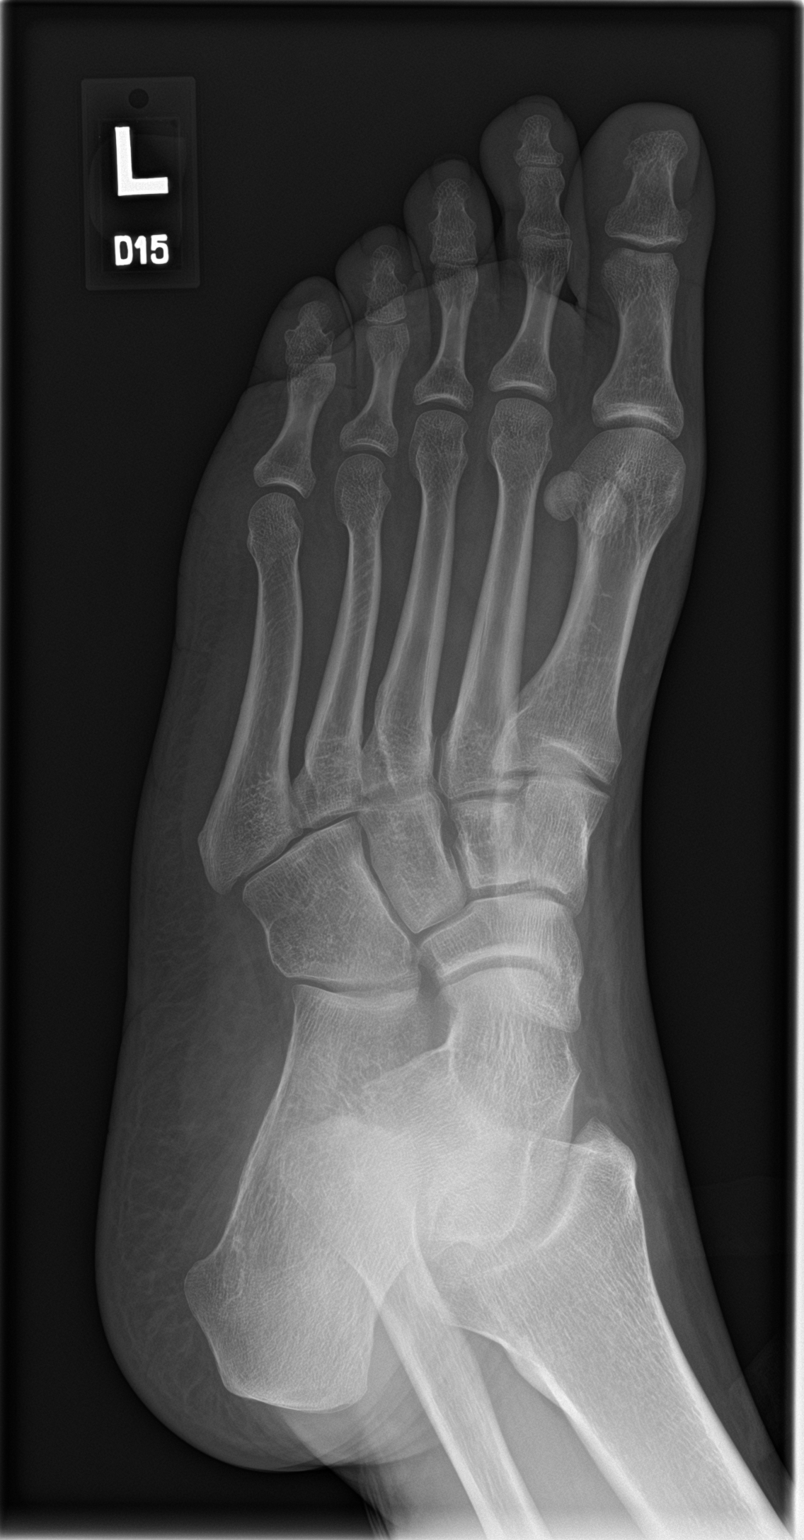

[foot lat]
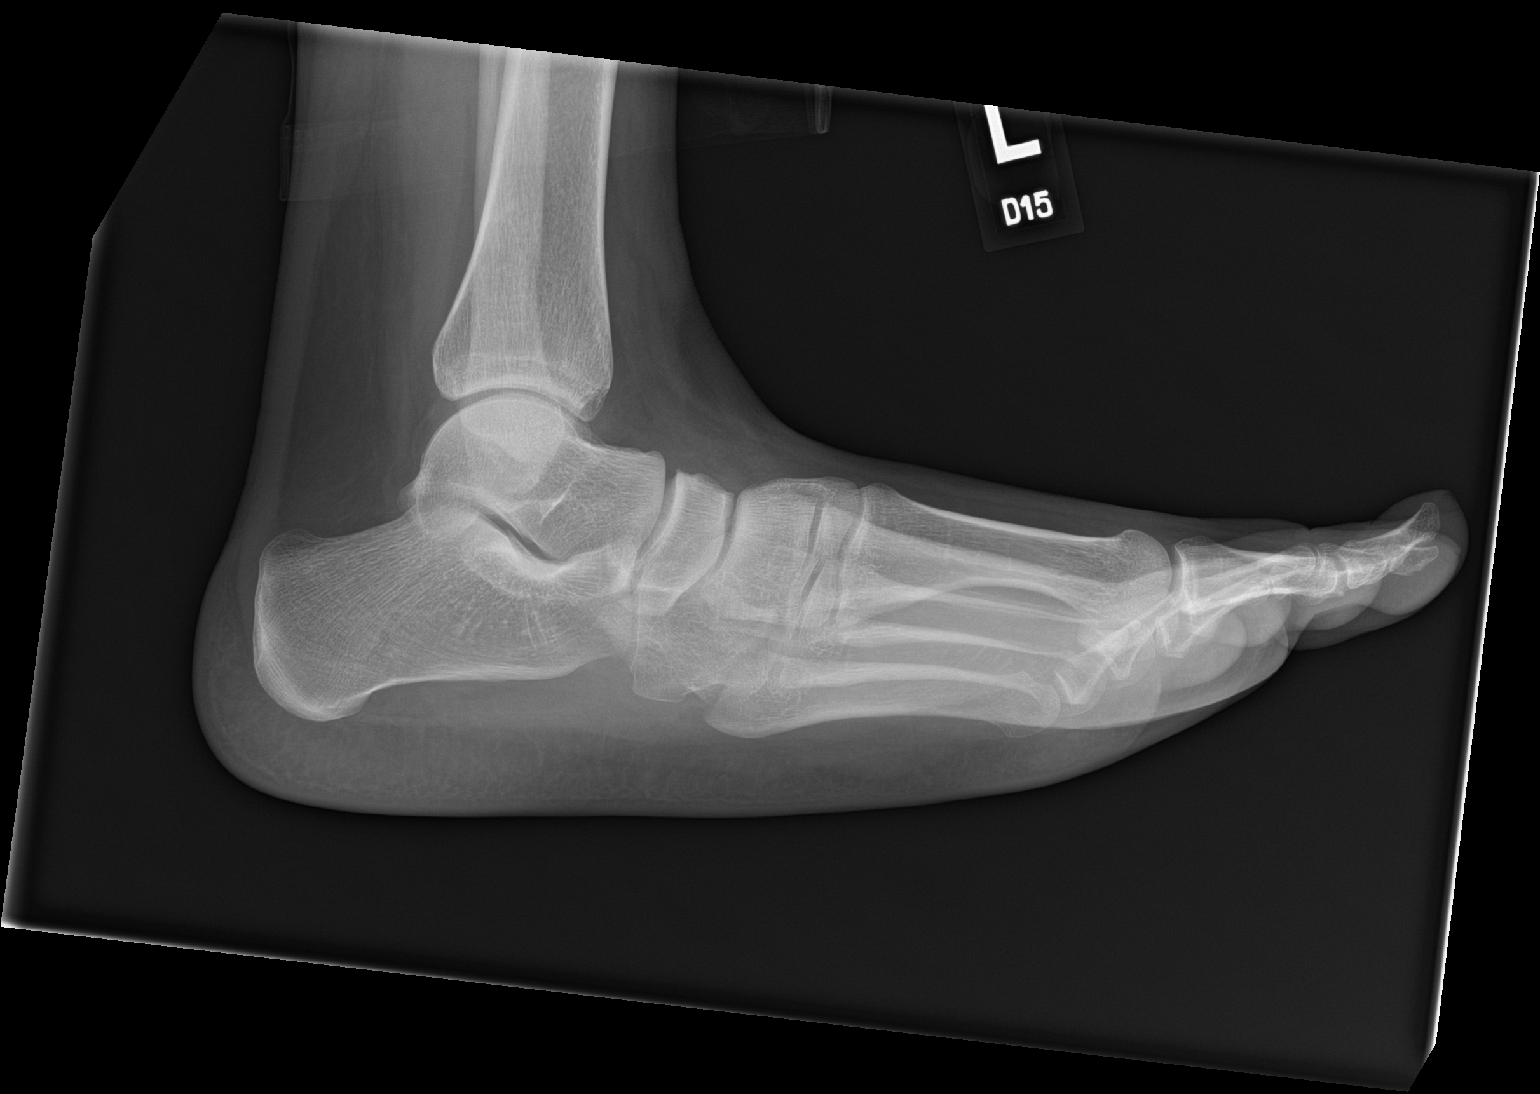

[3 of 3 positions shown; findings below may reference images not displayed]

FINDINGS: There is no evidence of fracture or dislocation. There is no
evidence of arthropathy or other focal bone abnormality. Soft
tissues are unremarkable.
IMPRESSION: Negative.

## 2021-01-15 DIAGNOSIS — F319 Bipolar disorder, unspecified: Secondary | ICD-10-CM | POA: Diagnosis not present

## 2021-02-05 ENCOUNTER — Emergency Department (HOSPITAL_COMMUNITY)
Admission: EM | Admit: 2021-02-05 | Discharge: 2021-02-05 | Disposition: A | Discharge status: Home / Self Care | Payer: Medicaid Other | Attending: Emergency Medicine | Admitting: Emergency Medicine

## 2021-02-05 ENCOUNTER — Other Ambulatory Visit: Payer: Self-pay

## 2021-02-05 DIAGNOSIS — Z91040 Latex allergy status: Secondary | ICD-10-CM | POA: Diagnosis not present

## 2021-02-05 DIAGNOSIS — J4540 Moderate persistent asthma, uncomplicated: Secondary | ICD-10-CM | POA: Insufficient documentation

## 2021-02-05 DIAGNOSIS — Z7951 Long term (current) use of inhaled steroids: Secondary | ICD-10-CM | POA: Insufficient documentation

## 2021-02-05 DIAGNOSIS — S86811A Strain of other muscle(s) and tendon(s) at lower leg level, right leg, initial encounter: Secondary | ICD-10-CM | POA: Diagnosis not present

## 2021-02-05 DIAGNOSIS — I10 Essential (primary) hypertension: Secondary | ICD-10-CM | POA: Insufficient documentation

## 2021-02-05 DIAGNOSIS — S86911A Strain of unspecified muscle(s) and tendon(s) at lower leg level, right leg, initial encounter: Secondary | ICD-10-CM | POA: Diagnosis not present

## 2021-02-05 DIAGNOSIS — S8991XA Unspecified injury of right lower leg, initial encounter: Secondary | ICD-10-CM | POA: Diagnosis present

## 2021-02-05 DIAGNOSIS — X501XXA Overexertion from prolonged static or awkward postures, initial encounter: Secondary | ICD-10-CM | POA: Diagnosis not present

## 2021-02-05 MED ORDER — NAPROXEN 375 MG PO TABS: 375.0000 mg | ORAL_TABLET | Freq: Two times a day (BID) | ORAL | 0 refills | Status: DC

## 2021-02-05 MED ORDER — KETOROLAC TROMETHAMINE 60 MG/2ML IM SOLN
60.0000 mg | Freq: Once | INTRAMUSCULAR | Status: AC
  Administered 2021-02-05: 60 mg via INTRAMUSCULAR
  Filled 2021-02-05: qty 2

## 2021-02-05 NOTE — ED Triage Notes (Addendum)
Pt complaining of R leg pain. 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 States its a 10/10 when ambulating.

## 2021-02-05 NOTE — Discharge Instructions (Signed)
Use naproxen as needed for pain twice daily. Use Tylenol 4 times daily as needed for pain 1000 mg. Continue to use ice every few hours while awake.  Gradually increase range of motion once pain improves.  Follow-up with sports medicine if no improvement in 1 week.  Return for shortness of breath, fevers or new concerns.

## 2021-02-05 NOTE — ED Provider Notes (Signed)
[facility] EMERGENCY DEPARTMENT Provider Note   CSN: [id number] Arrival date & time: 02/05/21  0820     History Chief Complaint  Patient presents with  . Leg Pain    Caitlin Hartman is a 30 y.o. female.  Patient with history of asthma, anemia, rheumatoid arthritis, fibromyalgia presents with right anterior knee pain gradually worsening since 2 days ago.  Patient had pain she noticed initially mild and gradually became more severe specifically with movements and standing.  Patient denies recent surgeries, blood clot history, estrogen use or other symptoms.  No shortness of breath, fevers or leg swelling.  Patient does drive often with her right leg and the pain is worse with doing this.        Past Medical History:  Diagnosis Date  . Anemia   . Asthma   . Dyspnea   . Fibromyalgia   . Headache   . Hypertension   . Lyme disease 2013  . Mental disorder   . 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 Vaginal Pap smear, abnormal     Patient Active Problem List   Diagnosis Date Noted  . Multiple joint pain 10/17/2020  . Carpal tunnel syndrome 05/09/2020  . Bacterial vaginosis 01/11/2020  . Need for history and physical examination for employment 09/02/2019  . IUD (intrauterine device) in place 03/01/2018  . Breast mass 11/08/2017  . Bipolar disorder ([facility]) 11/08/2017  . PTSD (post-traumatic stress disorder) 11/08/2017  . Iron deficiency anemia 09/11/2017  . ANA positive 04/16/2017  . Low vitamin D level 04/16/2017  . Fibromyalgia 04/07/2017  . Asthma, moderate persistent 01/31/2017  . Thalassemia trait 08/03/2015  . 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 INDUCED ABORTION     x 3   . TONSILLECTOMY    . WISDOM TOOTH EXTRACTION       OB History    Gravida  7   Para  4   Term  4   Preterm  0   AB  3   Living  4     SAB      IAB      Ectopic       Multiple  0   Live Births  1           Family History  Problem Relation Age of Onset  . Alcohol abuse Mother   . Drug abuse Mother   . Depression Mother   . Mental illness Mother   . Drug abuse Father   . Cancer Maternal Aunt   . Breast cancer Maternal Aunt   . Diabetes Maternal Uncle   . Alcohol abuse Maternal Grandmother   . Cancer Maternal Grandmother   . Diabetes Maternal Grandmother   . Varicose Veins Maternal Grandmother   . Drug abuse Maternal Grandfather   . Cancer Paternal Grandmother   . Breast cancer Paternal Grandmother     Social History   Tobacco Use  . Smoking status: Never Smoker  . Smokeless tobacco: Never Used  Vaping Use  . Vaping Use: Never used  Substance Use Topics  . Alcohol use: No  . Drug use: Yes    Frequency: 3.0 times per week    Types: Marijuana    Comment: pt reports using marijuana x3/ wk for her fibromyalgia    Home Medications Prior to Admission medications   Medication Sig Start Date End  Date Taking? Authorizing Provider  naproxen (NAPROSYN) 375 MG tablet Take 1 tablet (375 mg total) by mouth 2 (two) times daily. 02/05/21  Yes Blane Ohara, MD  albuterol (VENTOLIN HFA) 108 (90 Base) MCG/ACT inhaler Inhale 1-2 puffs into the lungs every 4 (four) hours as needed for wheezing or shortness of breath. 09/01/19 08/31/20  Marthenia Rolling, DO  budesonide-formoterol (SYMBICORT) 80-4.5 MCG/ACT inhaler Inhale 2 puffs into the lungs 2 (two) times daily. 09/01/19   Marthenia Rolling, DO  Cholecalciferol (VITAMIN D) 2000 units CAPS Take 1 capsule (2,000 Units total) by mouth daily. 02/26/18   Beaulah Dinning, MD  famotidine (PEPCID) 20 MG tablet Take 1 tablet (20 mg total) by mouth 2 (two) times daily for 14 days. 05/09/20 05/23/20  Dana Allan, MD  fluticasone (FLONASE) 50 MCG/ACT nasal spray Place 1 spray into both nostrils daily. 12/30/18   Petrucelli, Samantha R, PA-C  gabapentin (NEURONTIN) 100 MG capsule Take 1 capsule (100 mg total) by mouth 2  (two) times daily. 10/17/20   Towanda Octave, MD  methocarbamol (ROBAXIN) 500 MG tablet Take 1 tablet (500 mg total) by mouth 2 (two) times daily. 07/10/19   Aviva Kluver B, PA-C  metroNIDAZOLE (FLAGYL) 500 MG tablet Take 1 tablet (500 mg total) by mouth 2 (two) times daily. 01/10/20   Lennox Solders, MD  Propylene Glycol 0.95 % SOLN Apply 1-2 drops to eye 3 (three) times daily as needed. 03/11/18   Casey Burkitt, MD  tinidazole [facility]) 500 MG tablet Take 4 tablets (2,000 mg total) by mouth daily with breakfast. For two days 09/18/18   Sharyon Cable, CNM    Allergies    Latex and Oxycodone-acetaminophen  Review of Systems   Review of Systems  Constitutional: Negative for chills and fever.  HENT: Negative for congestion.   Eyes: Negative for visual disturbance.  Respiratory: Negative for shortness of breath.   Cardiovascular: Negative for chest pain.  Gastrointestinal: Negative for abdominal pain and vomiting.  Genitourinary: Negative for dysuria and flank pain.  Musculoskeletal: Positive for gait problem. Negative for back pain, neck pain and neck stiffness.  Skin: Negative for rash.  Neurological: Negative for light-headedness and headaches.    Physical Exam Updated Vital Signs BP 133/84   Pulse 79   Temp 97.6 F (36.4 C) (Oral)   Resp 20   SpO2 99%   Physical Exam Vitals and nursing note reviewed.  Constitutional:      Appearance: She is well-developed and well-nourished.  HENT:     Head: Normocephalic and atraumatic.  Eyes:     General:        Right eye: No discharge.        Left eye: No discharge.     Conjunctiva/sclera: Conjunctivae normal.  Neck:     Trachea: No tracheal deviation.  Cardiovascular:     Rate and Rhythm: Normal rate.  Pulmonary:     Effort: Pulmonary effort is normal.  Abdominal:     General: There is no distension.     Palpations: Abdomen is soft.     Tenderness: There is no abdominal tenderness. There is no guarding.   Musculoskeletal:        General: Tenderness present. No swelling, deformity or edema.     Cervical back: Normal range of motion and neck supple.     Right lower leg: No edema.     Left lower leg: No edema.     Comments: Patient has moderate tenderness right anterior knee near  the joint line, no external signs of infection.  Mild anterior swelling, no significant joint effusion. No posterior knee or calf tenderness.  Neurovascularly intact.  Significant pain with flexion and any testing varus or meniscal.  Skin:    General: Skin is warm.     Capillary Refill: Capillary refill takes less than 2 seconds.     Findings: No rash.  Neurological:     General: No focal deficit present.     Mental Status: She is alert and oriented to person, place, and time.  Psychiatric:        Mood and Affect: Mood and affect and mood normal.     ED Results / Procedures / Treatments   Labs (all labs ordered are listed, but only abnormal results are displayed) Labs Reviewed - No data to display  EKG None  Radiology No results found.  Procedures Procedures   Medications Ordered in ED Medications  ketorolac (TORADOL) injection 60 mg (60 mg Intramuscular Given 02/05/21 0856)    ED Course  I have reviewed the triage vital signs and the nursing notes.  Pertinent labs & imaging results that were available during my care of the patient were reviewed by me and considered in my medical decision making (see chart for details).    MDM Rules/Calculators/A&P                          Patient presents with clinically musculoskeletal knee pain likely from recurrent driving.  Patient has no risk factors for blood clots and pain is all anterior.  No leg edema.  Discussed supportive care and follow-up with sports medicine.  Strict reasons to return given.  Toradol given for pain.   Final Clinical Impression(s) / ED Diagnoses Final diagnoses:  Knee strain, right, initial encounter    Rx / DC Orders ED  Discharge Orders         Ordered    naproxen (NAPROSYN) 375 MG tablet  2 times daily        02/05/21 0901           Blane Ohara, MD 02/05/21 [phone]

## 2021-02-06 ENCOUNTER — Telehealth: Payer: Self-pay

## 2021-02-06 NOTE — Telephone Encounter (Signed)
Transition Care Management Follow-up Telephone Call  Date of discharge and from where: 02/05/2021 from [facility]  How have you been since you were released from the hospital? Pt states that she is feeling better today.   Any questions or concerns? No  Items Reviewed:  Did the pt receive and understand the discharge instructions provided? Yes   Medications obtained and verified? Yes   Other? No   Any new allergies since your discharge? No   Dietary orders reviewed? n/a  Do you have support at home? Yes    Functional Questionnaire: (I = Independent and D = Dependent) ADLs: I  Bathing/Dressing- I  Meal Prep- I  Eating- I  Maintaining continence- I  Transferring/Ambulation- I  Managing Meds- I  Follow up appointments reviewed:   PCP Hospital f/u appt confirmed? No    Specialist Hospital f/u appt confirmed? No    Are transportation arrangements needed? No   If their condition worsens, is the pt aware to call PCP or go to the Emergency Dept.? Yes  Was the patient provided with contact information for the PCP's office or ED? Yes  Was to pt encouraged to call back with questions or concerns? Yes

## 2021-02-07 DIAGNOSIS — F319 Bipolar disorder, unspecified: Secondary | ICD-10-CM | POA: Diagnosis not present

## 2022-05-14 ENCOUNTER — Encounter: Payer: Self-pay | Admitting: *Deleted

## 2022-09-10 ENCOUNTER — Other Ambulatory Visit: Payer: Self-pay

## 2022-09-10 ENCOUNTER — Emergency Department (HOSPITAL_COMMUNITY)
Admission: EM | Admit: 2022-09-10 | Discharge: 2022-09-10 | Disposition: A | Discharge status: Home / Self Care | Payer: Medicaid Other | Attending: Emergency Medicine | Admitting: Emergency Medicine

## 2022-09-10 ENCOUNTER — Encounter (HOSPITAL_COMMUNITY): Payer: Self-pay | Admitting: Emergency Medicine

## 2022-09-10 ENCOUNTER — Emergency Department (HOSPITAL_COMMUNITY): Payer: Medicaid Other

## 2022-09-10 DIAGNOSIS — M25571 Pain in right ankle and joints of right foot: Secondary | ICD-10-CM | POA: Diagnosis not present

## 2022-09-10 DIAGNOSIS — Z91040 Latex allergy status: Secondary | ICD-10-CM | POA: Insufficient documentation

## 2022-09-10 DIAGNOSIS — S99911A Unspecified injury of right ankle, initial encounter: Secondary | ICD-10-CM | POA: Diagnosis not present

## 2022-09-10 MED ORDER — OXYCODONE-ACETAMINOPHEN 5-325 MG PO TABS
1.0000 | ORAL_TABLET | Freq: Once | ORAL | Status: AC
  Administered 2022-09-10: 1 via ORAL
  Filled 2022-09-10: qty 1

## 2022-09-10 MED ORDER — ONDANSETRON 4 MG PO TBDP
8.0000 mg | ORAL_TABLET | Freq: Once | ORAL | Status: AC
  Administered 2022-09-10: 8 mg via ORAL
  Filled 2022-09-10: qty 2

## 2022-09-10 MED ORDER — IBUPROFEN 800 MG PO TABS: 800.0000 mg | ORAL_TABLET | Freq: Three times a day (TID) | ORAL | 0 refills | Status: DC

## 2022-09-10 NOTE — ED Provider Triage Note (Signed)
Emergency Medicine Provider Triage Evaluation Note  Caitlin Hartman , a 31 y.o. female  was evaluated in triage.  Pt complains of right ankle pain.  Happened a few hours ago when walking down the stairs, missed 2 steps and twisted the right ankle.  Pain to the right dorsum of foot as well as ankle.  Worse with movement, tearful.  Did not hit head, no pain elsewhere.  Review of Systems  Per HPI  Physical Exam  BP (!) 142/96 (BP Location: Right Arm)   Pulse 69   Temp 98.5 F (36.9 C) (Oral)   Resp 18   Ht 5\' 8"  (1.727 m)   Wt 99.8 kg   LMP 08/18/2022   SpO2 100%   BMI 33.45 kg/m  Gen:   Awake, no distress   Resp:  Normal effort  MSK:   Diffuse tenderness to right ankle and right dorsum of foot with palpation.  Does not tolerate passive ROM secondary to pain. Other:  Cap refill less than 2, DP and PT are palpable.  Medical Decision Making  Medically screening exam initiated at 1:03 PM.  Appropriate orders placed.  Caitlin Hartman was informed that the remainder of the evaluation will be completed by another provider, this initial triage assessment does not replace that evaluation, and the importance of remaining in the ED until their evaluation is complete.     Sherrill Raring, PA-C 09/10/22 1304

## 2022-09-10 NOTE — Discharge Instructions (Signed)
You have a small avulsion fracture on the talus of the right foot.  Wear the cam walking boot, use crutches as needed to get around.  You are able to walk on it as you are able to tolerate with pain.  Take ibuprofen 800 mg 3 times daily with food and water.  He can take it every 8 hours as needed for pain.  Follow-up with orthopedics in the next week or so, call the information above to schedule an appointment.

## 2022-09-10 NOTE — Progress Notes (Signed)
Orthopedic Tech Progress Note Patient Details:  Caitlin Hartman 1991-04-12 [id number]  Ortho Devices Type of Ortho Device: Crutches, CAM walker Ortho Device/Splint Location: RLE Ortho Device/Splint Interventions: Ordered, Application, Adjustment   Post Interventions Patient Tolerated: Well, Ambulated well Instructions Provided: Poper ambulation with device, Care of device  Janit Pagan 09/10/2022, 3:47 PM

## 2022-09-10 NOTE — ED Provider Notes (Signed)
[facility] EMERGENCY DEPARTMENT Provider Note   CSN: [id number] Arrival date & time: 09/10/22  1140     History  Chief Complaint  Patient presents with   Ankle Pain    Caitlin Hartman is a 31 y.o. female.   Ankle Pain    Patient presents due to right ankle pain.  This happened after tripping and landing down 2 steps.  Fell at the ankle twisted, did not hit her head or lose consciousness.  Pain has been constant since then, worse with movement and ambulation.  Home Medications Prior to Admission medications   Medication Sig Start Date End Date Taking? Authorizing Provider  ibuprofen (ADVIL) 800 MG tablet Take 1 tablet (800 mg total) by mouth 3 (three) times daily. 09/10/22  Yes Theron Arista, PA-C  albuterol (VENTOLIN HFA) 108 (90 Base) MCG/ACT inhaler Inhale 1-2 puffs into the lungs every 4 (four) hours as needed for wheezing or shortness of breath. 09/01/19 08/31/20  Marthenia Rolling, DO  budesonide-formoterol (SYMBICORT) 80-4.5 MCG/ACT inhaler Inhale 2 puffs into the lungs 2 (two) times daily. 09/01/19   Marthenia Rolling, DO  Cholecalciferol (VITAMIN D) 2000 units CAPS Take 1 capsule (2,000 Units total) by mouth daily. 02/26/18   Beaulah Dinning, MD  famotidine (PEPCID) 20 MG tablet Take 1 tablet (20 mg total) by mouth 2 (two) times daily for 14 days. 05/09/20 05/23/20  Dana Allan, MD  fluticasone (FLONASE) 50 MCG/ACT nasal spray Place 1 spray into both nostrils daily. 12/30/18   Petrucelli, Samantha R, PA-C  gabapentin (NEURONTIN) 100 MG capsule Take 1 capsule (100 mg total) by mouth 2 (two) times daily. 10/17/20   Towanda Octave, MD  methocarbamol (ROBAXIN) 500 MG tablet Take 1 tablet (500 mg total) by mouth 2 (two) times daily. 07/10/19   Aviva Kluver B, PA-C  metroNIDAZOLE (FLAGYL) 500 MG tablet Take 1 tablet (500 mg total) by mouth 2 (two) times daily. 01/10/20   Lennox Solders, MD  Propylene Glycol 0.95 % SOLN Apply 1-2 drops to eye 3 (three) times daily as needed.  03/11/18   Casey Burkitt, MD  tinidazole [facility]) 500 MG tablet Take 4 tablets (2,000 mg total) by mouth daily with breakfast. For two days 09/18/18   Sharyon Cable, CNM      Allergies    Latex and Oxycodone-acetaminophen    Review of Systems   Review of Systems  Physical Exam Updated Vital Signs BP (!) 142/96 (BP Location: Right Arm)   Pulse 69   Temp 98.5 F (36.9 C) (Oral)   Resp 18   Ht 5\' 8"  (1.727 m)   Wt 99.8 kg   LMP 08/18/2022   SpO2 100%   BMI 33.45 kg/m  Physical Exam Vitals and nursing note reviewed. Exam conducted with a chaperone present.  Constitutional:      General: She is not in acute distress.    Appearance: Normal appearance.  HENT:     Head: Normocephalic and atraumatic.  Eyes:     General: No scleral icterus.    Extraocular Movements: Extraocular movements intact.     Pupils: Pupils are equal, round, and reactive to light.  Cardiovascular:     Pulses: Normal pulses.  Musculoskeletal:        General: Swelling and tenderness present.     Comments: Right ankle pain, slight swelling to the lateral malleolus.  Tenderness with ROM, tolerates some passive ROM after pain medicine no crepitus.  Able to range knee, no tenderness to  tib-fib.  Skin:    Capillary Refill: Capillary refill takes less than 2 seconds.     Coloration: Skin is not jaundiced.  Neurological:     Mental Status: She is alert. Mental status is at baseline.     Coordination: Coordination normal.     ED Results / Procedures / Treatments   Labs (all labs ordered are listed, but only abnormal results are displayed) Labs Reviewed - No data to display  EKG None  Radiology DG Foot Complete Right  Result Date: 09/10/2022 CLINICAL DATA:  Right foot and ankle pain after a fall EXAM: RIGHT FOOT COMPLETE - 3+ VIEW COMPARISON:  None Available. FINDINGS: There is no acute fracture or dislocation. Bony alignment is normal. The joint spaces are preserved. The Lisfranc and  Chopart joints are intact. The soft tissues are unremarkable. IMPRESSION: No evidence of acute injury in the foot. Electronically Signed   By: Valetta Mole M.D.   On: 09/10/2022 14:35   DG Ankle Complete Right  Result Date: 09/10/2022 CLINICAL DATA:  Trauma, fall EXAM: RIGHT ANKLE - COMPLETE 3+ VIEW COMPARISON:  None Available. FINDINGS: No displaced fractures are seen. There is no dislocation. In the lateral view, there is a faint linear calcific density in the dorsal aspect of anterior talus. IMPRESSION: Small faint linear calcific density in the dorsal aspect of anterior talus may suggest recent or old avulsion. Electronically Signed   By: Elmer Picker M.D.   On: 09/10/2022 14:34    Procedures Procedures    Medications Ordered in ED Medications  ondansetron (ZOFRAN-ODT) disintegrating tablet 8 mg (8 mg Oral Given 09/10/22 1536)  oxyCODONE-acetaminophen (PERCOCET/ROXICET) 5-325 MG per tablet 1 tablet (1 tablet Oral Given 09/10/22 1535)    ED Course/ Medical Decision Making/ A&P                           Medical Decision Making Amount and/or Complexity of Data Reviewed Radiology: ordered.  Risk Prescription drug management.   Patient presents due to right ankle and foot pain.  Differential includes but not limited to fracture, dislocation, muscle sprain, tendon rupture.  On exam patient is neurovascular intact with brisk cap refill, DP and PT are 2+.  Tolerates passive ROM, lateralized swelling and reproducible tenderness although no crepitus.  I ordered, viewed and interpreted imaging studies.  Slight avulsion fracture anterior talus, agree with radiologist dictation.  I ordered CAM Walker boot, crutches and provided orthopedic follow-up.  Patient was given Zofran and Percocet in triage for pain and feels improved after reevaluation.  Patient discharged condition with strict return precautions.        Final Clinical Impression(s) / ED Diagnoses Final diagnoses:   Acute right ankle pain    Rx / DC Orders ED Discharge Orders          Ordered    ibuprofen (ADVIL) 800 MG tablet  3 times daily        09/10/22 1537              Sherrill Raring, PA-C 09/10/22 1848    Milton Ferguson, MD 09/12/22 [phone]

## 2022-09-10 NOTE — ED Triage Notes (Signed)
Patient reports walking down the stairs and missed the last two causing her right ankle to roll. Unable to put any weight on it since then.

## 2022-09-19 DIAGNOSIS — S93401A Sprain of unspecified ligament of right ankle, initial encounter: Secondary | ICD-10-CM | POA: Diagnosis not present

## 2022-10-07 DIAGNOSIS — F319 Bipolar disorder, unspecified: Secondary | ICD-10-CM | POA: Diagnosis not present

## 2022-10-08 ENCOUNTER — Ambulatory Visit: Payer: Medicaid Other | Admitting: Family Medicine

## 2022-10-15 ENCOUNTER — Ambulatory Visit: Payer: Medicaid Other | Admitting: Family Medicine

## 2022-10-15 ENCOUNTER — Telehealth: Payer: Self-pay | Admitting: Student

## 2022-10-15 NOTE — Telephone Encounter (Signed)
No show for new pt, dismissed from office

## 2022-10-15 NOTE — Telephone Encounter (Signed)
Pt was a no show for a NP app with Dr. Thompson on 10/15/22, I sent a letter.  

## 2023-01-15 ENCOUNTER — Telehealth: Payer: Medicaid Other | Admitting: Physician Assistant

## 2023-01-16 ENCOUNTER — Ambulatory Visit: Payer: Medicaid Other

## 2023-01-16 ENCOUNTER — Ambulatory Visit: Admission: EM | Admit: 2023-01-16 | Discharge: 2023-01-16 | Payer: Medicaid Other

## 2023-01-16 DIAGNOSIS — R1011 Right upper quadrant pain: Secondary | ICD-10-CM | POA: Diagnosis not present

## 2023-01-16 NOTE — Progress Notes (Signed)
The patient no-showed for appointment despite this provider sending direct link with no response and waiting for at least 10 minutes from appointment time for patient to join. They will be marked as a NS for this appointment/time.   Braya Habermehl Cody Ross Bender, PA-C    

## 2023-01-16 NOTE — ED Triage Notes (Signed)
Pt presents with right rib pain that radiates up to right shoulder with no known falls or injury X 4 days.

## 2023-01-16 NOTE — ED Notes (Signed)
Patient is being discharged from the Urgent Care and sent to the Emergency Department via personal vehicle . Per Provider Ewell Poe, patient is in need of higher level of care due to concerns of gallbladder. Patient is aware and verbalizes understanding of plan of care.    Vitals:   01/16/23 0908  BP: 135/87  Pulse: 71  Resp: 18  Temp: 97.9 F (36.6 C)  SpO2: 98%

## 2023-01-16 NOTE — ED Provider Notes (Signed)
[facility]    CSN: [id number] Arrival date & time: 01/16/23  0836      History   Chief Complaint Chief Complaint  Patient presents with   Rib Pain    HPI Caitlin Hartman is a 32 y.o. female.   Patient here today for evaluation of right upper abdominal pain that radiates up that has been present and worsening for 4 days. She has not had fever. She denies any nausea, vomiting or diarrhea. She denies any blood in her stool or dark tarry stools. She has not had any injury that she is aware of.   The history is provided by the patient.    Past Medical History:  Diagnosis Date   Anemia    Asthma    Dyspnea    Fibromyalgia    Headache    Hypertension    Lyme disease 2013   Mental disorder    Rheumatoid aortitis 2014   pt states dx'd by ED doctor, has not followed up    Vaginal Pap smear, abnormal     Patient Active Problem List   Diagnosis Date Noted   Multiple joint pain 10/17/2020   Carpal tunnel syndrome 05/09/2020   Bacterial vaginosis 01/11/2020   Need for history and physical examination for employment 09/02/2019   IUD (intrauterine device) in place 03/01/2018   Breast mass 11/08/2017   Bipolar disorder ([place]) 11/08/2017   PTSD (post-traumatic stress disorder) 11/08/2017   Iron deficiency anemia 09/11/2017   ANA positive 04/16/2017   Low vitamin D level 04/16/2017   Fibromyalgia 04/07/2017   Asthma, moderate persistent 01/31/2017   Thalassemia trait 08/03/2015   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 Authorizing Provider  albuterol  (VENTOLIN HFA) 108 (90 Base) MCG/ACT inhaler Inhale 1-2 puffs into the lungs every 4 (four) hours as needed for wheezing or shortness of breath. 09/01/19 08/31/20  Sherene Sires, DO  budesonide-formoterol (SYMBICORT) 80-4.5 MCG/ACT inhaler Inhale 2 puffs into the lungs 2 (two) times daily. 09/01/19   Sherene Sires, DO  Cholecalciferol (VITAMIN D) 2000 units CAPS Take 1 capsule (2,000 Units total) by mouth daily. 02/26/18   Carlyle Dolly, MD  famotidine (PEPCID) 20 MG tablet Take 1 tablet (20 mg total) by mouth 2 (two) times daily for 14 days. 05/09/20 05/23/20  Carollee Leitz, MD  fluticasone (FLONASE) 50 MCG/ACT nasal spray Place 1 spray into both nostrils daily. 12/30/18   Petrucelli, Samantha R, PA-C  gabapentin (NEURONTIN) 100 MG capsule Take 1 capsule (100 mg total) by mouth 2 (two) times daily. 10/17/20   Lattie Haw, MD  ibuprofen (ADVIL) 800 MG tablet Take 1 tablet (800 mg total) by mouth 3 (three) times daily. 09/10/22   Sherrill Raring, PA-C  methocarbamol (ROBAXIN) 500 MG tablet  Take 1 tablet (500 mg total) by mouth 2 (two) times daily. 07/10/19   Langston Masker B, PA-C  metroNIDAZOLE (FLAGYL) 500 MG tablet Take 1 tablet (500 mg total) by mouth 2 (two) times daily. 01/10/20   Kathrene Alu, MD  Propylene Glycol 0.95 % SOLN Apply 1-2 drops to eye 3 (three) times daily as needed. 03/11/18   Rogue Bussing, MD  tinidazole (TINDAMAX) 500 MG tablet Take 4 tablets (2,000 mg total) by mouth daily with breakfast. For two days 09/18/18   Lajean Manes, CNM    Family History Family History  Problem Relation Age of Onset   Alcohol abuse Mother    Drug abuse Mother    Depression Mother    Mental illness Mother    Drug abuse Father    Cancer Maternal Aunt    Breast cancer Maternal Aunt    Diabetes Maternal Uncle    Alcohol abuse Maternal Grandmother    Cancer Maternal Grandmother    Diabetes Maternal Grandmother    Varicose Veins Maternal Grandmother    Drug abuse Maternal Grandfather     Cancer Paternal Grandmother    Breast cancer Paternal Grandmother     Social History Social History   Tobacco Use   Smoking status: Never   Smokeless tobacco: Never  Vaping Use   Vaping Use: Never used  Substance Use Topics   Alcohol use: No   Drug use: Yes    Frequency: 3.0 times per week    Types: Marijuana    Comment: pt reports using marijuana x3/ wk for her fibromyalgia     Allergies   Latex and Oxycodone-acetaminophen   Review of Systems Review of Systems  Constitutional:  Negative for chills and fever.  Eyes:  Negative for discharge and redness.  Respiratory:  Negative for cough and shortness of breath.   Gastrointestinal:  Positive for abdominal pain. Negative for blood in stool, diarrhea, nausea and vomiting.     Physical Exam Triage Vital Signs ED Triage Vitals  Enc Vitals Group     BP 01/16/23 0908 135/87     Pulse Rate 01/16/23 0908 71     Resp 01/16/23 0908 18     Temp 01/16/23 0908 97.9 F (36.6 C)     Temp Source 01/16/23 0908 Oral     SpO2 01/16/23 0908 98 %     Weight --      Height --      Head Circumference --      Peak Flow --      Pain Score 01/16/23 0907 6     Pain Loc --      Pain Edu? --      Excl. in [place]? --    No data found.  Updated Vital Signs BP 135/87 (BP Location: Right Arm)   Pulse 71   Temp 97.9 F (36.6 C) (Oral)   Resp 18   LMP 01/06/2023   SpO2 98%     Physical Exam Vitals and nursing note reviewed.  Constitutional:      General: She is not in acute distress.    Appearance: Normal appearance. She is not ill-appearing.  HENT:     Head: Normocephalic and atraumatic.  Eyes:     Conjunctiva/sclera: Conjunctivae normal.  Cardiovascular:     Rate and Rhythm: Normal rate.  Pulmonary:     Effort: Pulmonary effort is normal. No respiratory distress.  Abdominal:     General: Abdomen is flat. Bowel sounds are normal.  There is no distension.     Palpations: Abdomen is soft.     Tenderness: There is abdominal  tenderness (RUQ- POSITIVE MURPHY'S SIGN). There is guarding. There is no rebound.  Neurological:     Mental Status: She is alert.  Psychiatric:        Mood and Affect: Mood normal.        Behavior: Behavior normal.        Thought Content: Thought content normal.      UC Treatments / Results  Labs (all labs ordered are listed, but only abnormal results are displayed) Labs Reviewed - No data to display  EKG   Radiology No results found.  Procedures Procedures (including critical care time)  Medications Ordered in UC Medications - No data to display  Initial Impression / Assessment and Plan / UC Course  I have reviewed the triage vital signs and the nursing notes.  Pertinent labs & imaging results that were available during my care of the patient were reviewed by me and considered in my medical decision making (see chart for details).    Findings concerning for gallbladder etiology. Recommended further evaluation in the ED. Patient is agreeable and will be transported via POV.   Final Clinical Impressions(s) / UC Diagnoses   Final diagnoses:  RUQ pain   Discharge Instructions   None    ED Prescriptions   None    PDMP not reviewed this encounter.   Francene Finders, PA-C 01/16/23 [phone]

## 2023-01-18 ENCOUNTER — Emergency Department (HOSPITAL_COMMUNITY)
Admission: EM | Admit: 2023-01-18 | Discharge: 2023-01-19 | Disposition: A | Discharge status: Home / Self Care | Payer: Medicaid Other | Attending: Emergency Medicine | Admitting: Emergency Medicine

## 2023-01-18 ENCOUNTER — Emergency Department (HOSPITAL_COMMUNITY): Payer: Medicaid Other

## 2023-01-18 ENCOUNTER — Other Ambulatory Visit: Payer: Self-pay

## 2023-01-18 DIAGNOSIS — Z91040 Latex allergy status: Secondary | ICD-10-CM | POA: Insufficient documentation

## 2023-01-18 DIAGNOSIS — R079 Chest pain, unspecified: Secondary | ICD-10-CM | POA: Diagnosis not present

## 2023-01-18 DIAGNOSIS — J9811 Atelectasis: Secondary | ICD-10-CM | POA: Diagnosis not present

## 2023-01-18 DIAGNOSIS — K8020 Calculus of gallbladder without cholecystitis without obstruction: Secondary | ICD-10-CM | POA: Diagnosis not present

## 2023-01-18 DIAGNOSIS — K828 Other specified diseases of gallbladder: Secondary | ICD-10-CM | POA: Diagnosis not present

## 2023-01-18 DIAGNOSIS — K8050 Calculus of bile duct without cholangitis or cholecystitis without obstruction: Secondary | ICD-10-CM | POA: Diagnosis not present

## 2023-01-18 DIAGNOSIS — R1011 Right upper quadrant pain: Secondary | ICD-10-CM | POA: Diagnosis not present

## 2023-01-18 DIAGNOSIS — J90 Pleural effusion, not elsewhere classified: Secondary | ICD-10-CM | POA: Diagnosis not present

## 2023-01-18 LAB — URINALYSIS, MICROSCOPIC (REFLEX)

## 2023-01-18 LAB — URINALYSIS, ROUTINE W REFLEX MICROSCOPIC
Bilirubin Urine: NEGATIVE
Glucose, UA: NEGATIVE mg/dL
Hgb urine dipstick: NEGATIVE
Ketones, ur: NEGATIVE mg/dL
Nitrite: NEGATIVE
Protein, ur: 30 mg/dL — AB
Specific Gravity, Urine: 1.026 (ref 1.005–1.030)
pH: 5 (ref 5.0–8.0)

## 2023-01-18 LAB — COMPREHENSIVE METABOLIC PANEL
ALT: 18 U/L (ref 0–44)
AST: 18 U/L (ref 15–41)
Albumin: 3.9 g/dL (ref 3.5–5.0)
Alkaline Phosphatase: 75 U/L (ref 38–126)
Anion gap: 11 (ref 5–15)
BUN: 9 mg/dL (ref 6–20)
CO2: 25 mmol/L (ref 22–32)
Calcium: 8.7 mg/dL — ABNORMAL LOW (ref 8.9–10.3)
Chloride: 101 mmol/L (ref 98–111)
Creatinine, Ser: 0.97 mg/dL (ref 0.44–1.00)
GFR, Estimated: >60 mL/min (ref >60)
Glucose, Bld: 90 mg/dL (ref 70–99)
Potassium: 3.5 mmol/L (ref 3.5–5.1)
Sodium: 137 mmol/L (ref 135–145)
Total Bilirubin: 0.5 mg/dL (ref 0.3–1.2)
Total Protein: 8 g/dL (ref 6.5–8.1)

## 2023-01-18 LAB — I-STAT BETA HCG BLOOD, ED (MC, WL, AP ONLY): I-stat hCG, quantitative: <5 m[IU]/mL (ref <5)

## 2023-01-18 LAB — LIPASE, BLOOD: Lipase: 26 U/L (ref 11–51)

## 2023-01-18 MED ORDER — ALUM & MAG HYDROXIDE-SIMETH 200-200-20 MG/5ML PO SUSP
30.0000 mL | Freq: Once | ORAL | Status: AC
  Administered 2023-01-18: 30 mL via ORAL
  Filled 2023-01-18: qty 30

## 2023-01-18 MED ORDER — LIDOCAINE VISCOUS HCL 2 % MT SOLN
15.0000 mL | Freq: Once | OROMUCOSAL | Status: AC
  Administered 2023-01-18: 15 mL via ORAL
  Filled 2023-01-18: qty 15

## 2023-01-18 MED ORDER — DICYCLOMINE HCL 10 MG PO CAPS
10.0000 mg | ORAL_CAPSULE | Freq: Once | ORAL | Status: AC
  Administered 2023-01-18: 10 mg via ORAL
  Filled 2023-01-18: qty 1

## 2023-01-18 NOTE — ED Triage Notes (Signed)
Pt reports right side rib pain into back that radiates up to right shoulder x4 days. Pt reports increased pain with lying on right side. Pt reports feeling bloated. Pt reports increased pain with respirations.

## 2023-01-18 NOTE — ED Provider Notes (Signed)
EMERGENCY DEPARTMENT AT [facility] Provider Note   CSN: [id number] Arrival date & time: 01/18/23  2158     History {Add pertinent medical, surgical, social history, OB history to HPI:1} Chief Complaint  Patient presents with   Flank Pain    Caitlin Hartman is a 32 y.o. female.  32 year old female presents to the emergency department for evaluation of right upper quadrant abdominal pain which began 5 days ago.  She states the pain is intermittent, but worse at nighttime with associated bloating.  Pain is sharp when taking a deep breath, though she has not felt specifically short of breath.  She has tried over-the-counter analgesics without any symptomatic relief.  Denies any worsening pain after eating.  She has not had any nausea, vomiting, fevers, bowel changes, urinary symptoms, vaginal bleeding or discharge.  No history of prior abdominal surgeries.  The history is provided by the patient. No language interpreter was used.  Flank Pain       Home Medications Prior to Admission medications   Medication Sig Start Date End Date Taking? Authorizing Provider  albuterol (VENTOLIN HFA) 108 (90 Base) MCG/ACT inhaler Inhale 2 puffs into the lungs every 6 (six) hours as needed for wheezing or shortness of breath.   Yes [provider]  naproxen (NAPROSYN) 250 MG tablet Take 250 mg by mouth 2 (two) times daily as needed for moderate pain.   Yes [provider]  albuterol (VENTOLIN HFA) 108 (90 Base) MCG/ACT inhaler Inhale 1-2 puffs into the lungs every 4 (four) hours as needed for wheezing or shortness of breath. 09/01/19 08/31/20  Sherene Sires, DO  budesonide-formoterol (SYMBICORT) 80-4.5 MCG/ACT inhaler Inhale 2 puffs into the lungs 2 (two) times daily. Patient not taking: Reported on 01/18/2023 09/01/19   Sherene Sires, DO  Cholecalciferol (VITAMIN D) 2000 units CAPS Take 1 capsule (2,000 Units total) by mouth daily. Patient not taking: Reported on  01/18/2023 02/26/18   Carlyle Dolly, MD  famotidine (PEPCID) 20 MG tablet Take 1 tablet (20 mg total) by mouth 2 (two) times daily for 14 days. 05/09/20 05/23/20  Carollee Leitz, MD  fluticasone (FLONASE) 50 MCG/ACT nasal spray Place 1 spray into both nostrils daily. Patient not taking: Reported on 01/18/2023 12/30/18   Petrucelli, Aldona Bar R, PA-C  gabapentin (NEURONTIN) 100 MG capsule Take 1 capsule (100 mg total) by mouth 2 (two) times daily. Patient not taking: Reported on 01/18/2023 10/17/20   Lattie Haw, MD  ibuprofen (ADVIL) 800 MG tablet Take 1 tablet (800 mg total) by mouth 3 (three) times daily. Patient not taking: Reported on 01/18/2023 09/10/22   Sherrill Raring, PA-C  methocarbamol (ROBAXIN) 500 MG tablet Take 1 tablet (500 mg total) by mouth 2 (two) times daily. Patient not taking: Reported on 01/18/2023 07/10/19   Langston Masker B, PA-C  metroNIDAZOLE (FLAGYL) 500 MG tablet Take 1 tablet (500 mg total) by mouth 2 (two) times daily. Patient not taking: Reported on 01/18/2023 01/10/20   Kathrene Alu, MD  Propylene Glycol 0.95 % SOLN Apply 1-2 drops to eye 3 (three) times daily as needed. Patient not taking: Reported on 01/18/2023 03/11/18   Rogue Bussing, MD  tinidazole [facility]) 500 MG tablet Take 4 tablets (2,000 mg total) by mouth daily with breakfast. For two days Patient not taking: Reported on 01/18/2023 09/18/18   Lajean Manes, CNM      Allergies    Latex and Oxycodone-acetaminophen    Review of Systems   Review  of Systems  Genitourinary:  Positive for flank pain.  Ten systems reviewed and are negative for acute change, except as noted in the HPI.    Physical Exam Updated Vital Signs BP (!) 142/96   Pulse 91   Temp 98 F (36.7 C)   Resp 16   Ht 5' 9"$  (1.753 m)   Wt 99.8 kg   LMP 01/06/2023 (Exact Date)   SpO2 100%   BMI 32.49 kg/m   Physical Exam Vitals and nursing note reviewed.  Constitutional:      General: She is not in acute distress.     Appearance: She is well-developed. She is not diaphoretic.     Comments: Obese AA female  HENT:     Head: Normocephalic and atraumatic.  Eyes:     General: No scleral icterus.    Conjunctiva/sclera: Conjunctivae normal.  Cardiovascular:     Rate and Rhythm: Normal rate and regular rhythm.     Pulses: Normal pulses.  Pulmonary:     Effort: Pulmonary effort is normal. No respiratory distress.     Comments: Respirations even and unlabored.  Lungs clear to auscultation bilaterally. Abdominal:     Palpations: Abdomen is soft. There is no mass.     Tenderness: There is abdominal tenderness (RUQ TTP). There is no guarding.     Comments: Abdomen is soft and obese.  There is focal right upper quadrant tenderness with voluntary guarding.  No peritoneal signs or palpable masses.  Musculoskeletal:        General: Normal range of motion.     Cervical back: Normal range of motion.  Skin:    General: Skin is warm and dry.     Coloration: Skin is not pale.     Findings: No erythema or rash.  Neurological:     Mental Status: She is alert and oriented to person, place, and time.     Coordination: Coordination normal.  Psychiatric:        Behavior: Behavior normal.     ED Results / Procedures / Treatments   Labs (all labs ordered are listed, but only abnormal results are displayed) Labs Reviewed  CBC WITH DIFFERENTIAL/PLATELET - Abnormal; Notable for the following components:      Result Value   RBC 5.18 (*)    MCV 73.4 (*)    MCH 23.2 (*)    All other components within normal limits  URINALYSIS, ROUTINE W REFLEX MICROSCOPIC  COMPREHENSIVE METABOLIC PANEL  LIPASE, BLOOD  I-STAT BETA HCG BLOOD, ED ([facility], [facility], [facility] ONLY)    EKG None  Radiology No results found.  Procedures Procedures  {Document cardiac monitor, telemetry assessment procedure when appropriate:1}  Medications Ordered in ED Medications - No data to display  ED Course/ Medical Decision Making/ A&P   {   Click here  for ABCD2, HEART and other calculatorsREFRESH Note before signing :1}                          Medical Decision Making Amount and/or Complexity of Data Reviewed Labs: ordered. Radiology: ordered.   ***  {Document critical care time when appropriate:1} {Document review of labs and clinical decision tools ie heart score, Chads2Vasc2 etc:1}  {Document your independent review of radiology images, and any outside records:1} {Document your discussion with family members, caretakers, and with consultants:1} {Document social determinants of health affecting pt's care:1} {Document your decision making why or why not admission, treatments were needed:1} Final Clinical Impression(s) /  ED Diagnoses Final diagnoses:  None    Rx / DC Orders ED Discharge Orders     None

## 2023-01-19 LAB — CBC WITH DIFFERENTIAL/PLATELET
Abs Immature Granulocytes: 0 10*3/uL (ref 0.00–0.07)
Basophils Absolute: 0 10*3/uL (ref 0.0–0.1)
Basophils Relative: 0 %
Eosinophils Absolute: 0.6 10*3/uL — ABNORMAL HIGH (ref 0.0–0.5)
Eosinophils Relative: 8 %
HCT: 38 % (ref 36.0–46.0)
Hemoglobin: 12 g/dL (ref 12.0–15.0)
Lymphocytes Relative: 38 %
Lymphs Abs: 2.9 10*3/uL (ref 0.7–4.0)
MCH: 23.2 pg — ABNORMAL LOW (ref 26.0–34.0)
MCHC: 31.6 g/dL (ref 30.0–36.0)
MCV: 73.4 fL — ABNORMAL LOW (ref 80.0–100.0)
Monocytes Absolute: 0.4 10*3/uL (ref 0.1–1.0)
Monocytes Relative: 5 %
Neutro Abs: 3.7 10*3/uL (ref 1.7–7.7)
Neutrophils Relative %: 49 %
Platelets: 336 10*3/uL (ref 150–400)
RBC: 5.18 MIL/uL — ABNORMAL HIGH (ref 3.87–5.11)
RDW: 14.8 % (ref 11.5–15.5)
WBC: 7.5 10*3/uL (ref 4.0–10.5)
nRBC: 0 % (ref 0.0–0.2)
nRBC: 1 /100 WBC — ABNORMAL HIGH

## 2023-01-19 MED ORDER — DICYCLOMINE HCL 20 MG PO TABS: 20.0000 mg | ORAL_TABLET | Freq: Two times a day (BID) | ORAL | 0 refills | Status: DC | PRN

## 2023-01-19 NOTE — Discharge Instructions (Addendum)
Your evaluation today was notable for gallstones.  We believe this to be contributing to your abdominal pain.  Take Bentyl as needed for management of recurrent symptoms.  You have been provided information on general surgery.  We recommend outpatient follow-up to discuss elective gallbladder surgery, especially if pain remains persistent.  Return to the ED for new or concerning symptoms.

## 2023-01-21 ENCOUNTER — Telehealth: Payer: Self-pay

## 2023-01-21 NOTE — Transitions of Care (Post Inpatient/ED Visit) (Signed)
   01/21/2023  Name: Caitlin Hartman MRN: [id number] DOB: 26-Mar-1991  Today's TOC FU Call Status: Today's TOC FU Call Status:: Successful TOC FU Call Competed TOC FU Call Complete Date: 01/21/23  Transition Care Management Follow-up Telephone Call Date of Discharge: 01/19/23 Discharge Facility: [facility] Type of Discharge: Emergency Department Reason for ED Visit: Other: How have you been since you were released from the hospital?: Better Any questions or concerns?: Yes Patient Questions/Concerns:: Patient stated [facility] does not take Medicaid and needed other options Patient Questions/Concerns Addressed:  (BSW contacted [place] and they take all medicaid except [place], BSW contacted [facility] and they do accept Medicaid. BSW provided this information to patient.)  Items Reviewed: Did you receive and understand the discharge instructions provided?: Yes Medications obtained and verified?: Yes (Medications Reviewed) Any new allergies since your discharge?: No Do you have support at home?: Yes People in Home: significant other, child(ren), dependent  Home Care and Equipment/Supplies: [place] Ordered?: NA Any new equipment or medical supplies ordered?: NA  Functional Questionnaire: Do you need assistance with bathing/showering or dressing?: No Do you need assistance with meal preparation?: No Do you need assistance with eating?: No Do you have difficulty maintaining continence: No Do you need assistance with getting out of bed/getting out of a chair/moving?: No Do you have difficulty managing or taking your medications?: No  Folllow up appointments reviewed: PCP Follow-up appointment confirmed?: NA Specialist Hospital Follow-up appointment confirmed?: NA Do you need transportation to your follow-up appointment?: No Do you understand care options if your condition(s) worsen?: Yes-patient verbalized  understanding    Mickel Fuchs, BSW, [place] Medicaid Team  [phone]

## 2023-01-24 ENCOUNTER — Telehealth: Payer: Medicaid Other | Admitting: Physician Assistant

## 2023-01-24 DIAGNOSIS — K8020 Calculus of gallbladder without cholecystitis without obstruction: Secondary | ICD-10-CM

## 2023-01-24 NOTE — Patient Instructions (Addendum)
Allayne Stack, thank you for joining [name], PA-C for today's virtual visit.  While this provider is not your primary care provider (PCP), if your PCP is located in our provider database this encounter information will be shared with them immediately following your visit.   [place] account gives you access to today's visit and all your visits, tests, and labs performed at [facility] " click here if you don't have a [facility] account or go to [email or web address]  Consent: (Patient) JAIDALYNN ACCURSO provided verbal consent for this virtual visit at the beginning of the encounter.  Current Medications:  Current Outpatient Medications:    albuterol (VENTOLIN HFA) 108 (90 Base) MCG/ACT inhaler, Inhale 1-2 puffs into the lungs every 4 (four) hours as needed for wheezing or shortness of breath., Disp: 6.7 g, Rfl: 2   albuterol (VENTOLIN HFA) 108 (90 Base) MCG/ACT inhaler, Inhale 2 puffs into the lungs every 6 (six) hours as needed for wheezing or shortness of breath., Disp: , Rfl:    budesonide-formoterol (SYMBICORT) 80-4.5 MCG/ACT inhaler, Inhale 2 puffs into the lungs 2 (two) times daily. (Patient not taking: Reported on 01/18/2023), Disp: 1 Inhaler, Rfl: 3   Cholecalciferol (VITAMIN D) 2000 units CAPS, Take 1 capsule (2,000 Units total) by mouth daily. (Patient not taking: Reported on 01/18/2023), Disp: 30 capsule, Rfl: 2   dicyclomine (BENTYL) 20 MG tablet, Take 1 tablet (20 mg total) by mouth every 12 (twelve) hours as needed (for abdominal pain/cramping)., Disp: 20 tablet, Rfl: 0   famotidine (PEPCID) 20 MG tablet, Take 1 tablet (20 mg total) by mouth 2 (two) times daily for 14 days., Disp: 28 tablet, Rfl: 0   fluticasone (FLONASE) 50 MCG/ACT nasal spray, Place 1 spray into both nostrils daily. (Patient not taking: Reported on 01/18/2023), Disp: 16 g, Rfl: 0   gabapentin (NEURONTIN) 100 MG capsule, Take 1 capsule (100 mg total) by mouth 2  (two) times daily. (Patient not taking: Reported on 01/18/2023), Disp: 30 capsule, Rfl: 0   ibuprofen (ADVIL) 800 MG tablet, Take 1 tablet (800 mg total) by mouth 3 (three) times daily. (Patient not taking: Reported on 01/18/2023), Disp: 21 tablet, Rfl: 0   methocarbamol (ROBAXIN) 500 MG tablet, Take 1 tablet (500 mg total) by mouth 2 (two) times daily. (Patient not taking: Reported on 01/18/2023), Disp: 10 tablet, Rfl: 0   metroNIDAZOLE (FLAGYL) 500 MG tablet, Take 1 tablet (500 mg total) by mouth 2 (two) times daily. (Patient not taking: Reported on 01/18/2023), Disp: 14 tablet, Rfl: 0   naproxen (NAPROSYN) 250 MG tablet, Take 250 mg by mouth 2 (two) times daily as needed for moderate pain., Disp: , Rfl:    Propylene Glycol 0.95 % SOLN, Apply 1-2 drops to eye 3 (three) times daily as needed. (Patient not taking: Reported on 01/18/2023), Disp: 72 each, Rfl: 0   tinidazole (TINDAMAX) 500 MG tablet, Take 4 tablets (2,000 mg total) by mouth daily with breakfast. For two days (Patient not taking: Reported on 01/18/2023), Disp: 8 tablet, Rfl: 2   Medications ordered in this encounter:  No orders of the defined types were placed in this encounter.    *If you need refills on other medications prior to your next appointment, please contact your pharmacy*  Follow-Up: Call back or seek an in-person evaluation if the symptoms worsen or if the condition fails to improve as anticipated.  [place] [phone]  Other Instructions [place]  [place], [place]  Mickel Fuchs, MSW [phone]     If you have been instructed to have an in-person evaluation today at a local Urgent Care facility, please use the link below. It will take you to a list of all of our available [facility], including address, phone number and hours of operation. Please do not delay care.  [facility]  If you or a family member do not  have a primary care provider, use the link below to schedule a visit and establish care. When you choose a [facility] primary care physician or advanced practice provider, you gain a long-term partner in health. Find a Primary Care Provider  Learn more about [facility] in-office and virtual care options: [facility]

## 2023-01-24 NOTE — Progress Notes (Signed)
Virtual Visit Consent   Caitlin Hartman, you are scheduled for a virtual visit with a [facility] provider today. Just as with appointments in the office, your consent must be obtained to participate. Your consent will be active for this visit and any virtual visit you may have with one of our providers in the next 365 days. If you have a MyChart account, a copy of this consent can be sent to you electronically.  As this is a virtual visit, video technology does not allow for your provider to perform a traditional examination. This may limit your provider's ability to fully assess your condition. If your provider identifies any concerns that need to be evaluated in person or the need to arrange testing (such as labs, EKG, etc.), we will make arrangements to do so. Although advances in technology are sophisticated, we cannot ensure that it will always work on either your end or our end. If the connection with a video visit is poor, the visit may have to be switched to a telephone visit. With either a video or telephone visit, we are not always able to ensure that we have a secure connection.  By engaging in this virtual visit, you consent to the provision of healthcare and authorize for your insurance to be billed (if applicable) for the services provided during this visit. Depending on your insurance coverage, you may receive a charge related to this service.  I need to obtain your verbal consent now. Are you willing to proceed with your visit today? [name] has provided verbal consent on 01/24/2023 for a virtual visit (video or telephone). Caitlin Hartman, [place]  Date: 01/24/2023 8:37 AM  Virtual Visit via Video Note   I, Caitlin Hartman, connected with  Caitlin Hartman  ([id number], 02-Jul-1991) on 01/24/23 at  8:15 AM EST by a video-enabled telemedicine application and verified that I am speaking with the correct person using two identifiers.  Location: Patient: Virtual Visit  Location Patient: Home Provider: Virtual Visit Location Provider: Home   I discussed the limitations of evaluation and management by telemedicine and the availability of in person appointments. The patient expressed understanding and agreed to proceed.    History of Present Illness: Caitlin Hartman is a 32 y.o. who identifies as a female who was assigned female at birth, and is being seen today for follow-up questions from recent ER visit where she was diagnosed with gallstones. Thankfully asymptomatic at present but has questions about a referral that was placed to general surgery as she is having issue getting that set up.   HPI: HPI  Problems:  Patient Active Problem List   Diagnosis Date Noted   Multiple joint pain 10/17/2020   Carpal tunnel syndrome 05/09/2020   Bacterial vaginosis 01/11/2020   Need for history and physical examination for employment 09/02/2019   IUD (intrauterine device) in place 03/01/2018   Breast mass 11/08/2017   Bipolar disorder ([place]) 11/08/2017   PTSD (post-traumatic stress disorder) 11/08/2017   Iron deficiency anemia 09/11/2017   ANA positive 04/16/2017   Low vitamin D level 04/16/2017   Fibromyalgia 04/07/2017   Asthma, moderate persistent 01/31/2017   Thalassemia trait 08/03/2015   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    Allergies:  Allergies  Allergen Reactions   Latex Hives   Oxycodone-Acetaminophen Nausea And Vomiting   Medications:  Current Outpatient Medications:    albuterol (VENTOLIN HFA) 108 (90 Base) MCG/ACT inhaler, Inhale 1-2 puffs into the lungs every 4 (four) hours as  needed for wheezing or shortness of breath., Disp: 6.7 g, Rfl: 2   albuterol (VENTOLIN HFA) 108 (90 Base) MCG/ACT inhaler, Inhale 2 puffs into the lungs every 6 (six) hours as needed for wheezing or shortness of breath., Disp: , Rfl:    budesonide-formoterol (SYMBICORT) 80-4.5 MCG/ACT inhaler, Inhale 2 puffs into the lungs 2 (two) times daily. (Patient not taking: Reported  on 01/18/2023), Disp: 1 Inhaler, Rfl: 3   Cholecalciferol (VITAMIN D) 2000 units CAPS, Take 1 capsule (2,000 Units total) by mouth daily. (Patient not taking: Reported on 01/18/2023), Disp: 30 capsule, Rfl: 2   dicyclomine (BENTYL) 20 MG tablet, Take 1 tablet (20 mg total) by mouth every 12 (twelve) hours as needed (for abdominal pain/cramping)., Disp: 20 tablet, Rfl: 0   famotidine (PEPCID) 20 MG tablet, Take 1 tablet (20 mg total) by mouth 2 (two) times daily for 14 days., Disp: 28 tablet, Rfl: 0   fluticasone (FLONASE) 50 MCG/ACT nasal spray, Place 1 spray into both nostrils daily. (Patient not taking: Reported on 01/18/2023), Disp: 16 g, Rfl: 0   gabapentin (NEURONTIN) 100 MG capsule, Take 1 capsule (100 mg total) by mouth 2 (two) times daily. (Patient not taking: Reported on 01/18/2023), Disp: 30 capsule, Rfl: 0   ibuprofen (ADVIL) 800 MG tablet, Take 1 tablet (800 mg total) by mouth 3 (three) times daily. (Patient not taking: Reported on 01/18/2023), Disp: 21 tablet, Rfl: 0   methocarbamol (ROBAXIN) 500 MG tablet, Take 1 tablet (500 mg total) by mouth 2 (two) times daily. (Patient not taking: Reported on 01/18/2023), Disp: 10 tablet, Rfl: 0   metroNIDAZOLE (FLAGYL) 500 MG tablet, Take 1 tablet (500 mg total) by mouth 2 (two) times daily. (Patient not taking: Reported on 01/18/2023), Disp: 14 tablet, Rfl: 0   naproxen (NAPROSYN) 250 MG tablet, Take 250 mg by mouth 2 (two) times daily as needed for moderate pain., Disp: , Rfl:    Propylene Glycol 0.95 % SOLN, Apply 1-2 drops to eye 3 (three) times daily as needed. (Patient not taking: Reported on 01/18/2023), Disp: 72 each, Rfl: 0   tinidazole (TINDAMAX) 500 MG tablet, Take 4 tablets (2,000 mg total) by mouth daily with breakfast. For two days (Patient not taking: Reported on 01/18/2023), Disp: 8 tablet, Rfl: 2  Observations/Objective: Patient is well-developed, well-nourished in no acute distress.  Resting comfortably at home.  Head is normocephalic,  atraumatic.  No labored breathing. Speech is clear and coherent with logical content.  Patient is alert and oriented at baseline.   Assessment and Plan: 1. Gallstones  Asymptomatic. Reviewed EMR and social work/patient advocate notes regarding referral. Gave patient information for [facility] that accepts her insurance and well as the line for the advocate working on this referral for her, Mickel Fuchs. ER precautions reviewed. No charge for video.   Follow Up Instructions: I discussed the assessment and treatment plan with the patient. The patient was provided an opportunity to ask questions and all were answered. The patient agreed with the plan and demonstrated an understanding of the instructions.  A copy of instructions were sent to the patient via MyChart unless otherwise noted below.    The patient was advised to call back or seek an in-person evaluation if the symptoms worsen or if the condition fails to improve as anticipated.  Time:  I spent 10 minutes with the patient via telehealth technology discussing the above problems/concerns.    Caitlin Rio, PA-C

## 2023-01-27 ENCOUNTER — Ambulatory Visit: Payer: Medicaid Other | Admitting: Family Medicine

## 2023-02-11 ENCOUNTER — Ambulatory Visit: Payer: Medicaid Other

## 2023-02-11 VITALS — BP 130/66 | HR 73 | Temp 97.9°F | Ht 69.0 in | Wt 217.0 lb

## 2023-02-11 DIAGNOSIS — R7989 Other specified abnormal findings of blood chemistry: Secondary | ICD-10-CM | POA: Diagnosis not present

## 2023-02-11 DIAGNOSIS — D508 Other iron deficiency anemias: Secondary | ICD-10-CM

## 2023-02-11 DIAGNOSIS — J4540 Moderate persistent asthma, uncomplicated: Secondary | ICD-10-CM | POA: Diagnosis not present

## 2023-02-11 DIAGNOSIS — K8020 Calculus of gallbladder without cholecystitis without obstruction: Secondary | ICD-10-CM | POA: Insufficient documentation

## 2023-02-11 DIAGNOSIS — Z1159 Encounter for screening for other viral diseases: Secondary | ICD-10-CM

## 2023-02-11 DIAGNOSIS — R718 Other abnormality of red blood cells: Secondary | ICD-10-CM | POA: Insufficient documentation

## 2023-02-11 DIAGNOSIS — M797 Fibromyalgia: Secondary | ICD-10-CM | POA: Diagnosis not present

## 2023-02-11 DIAGNOSIS — Z0000 Encounter for general adult medical examination without abnormal findings: Secondary | ICD-10-CM | POA: Insufficient documentation

## 2023-02-11 NOTE — Assessment & Plan Note (Addendum)
Per OBGYN note 08/2017 electrophoresis normal and not consistent with thalassemia trait. Recent CBC 01/2023 with no anemia but did have microcytosis. Will f/u iron studies today, do not want to miss any iron deficiency.

## 2023-02-11 NOTE — Assessment & Plan Note (Signed)
Patient recently went to the ED for abdominal pain and was found to have cholelithiasis on RUQ. CMP wnl at this time, no fever, no juandice. Consistent with non-obstructive cholelithiasis without cholecystitis. Continues to have intermittent pain. Still no fever or chills, no juandice to suggest cholecystitis or obstruction. No relationship to food. On exam minimal RUQ ptp but murphy positive. Called [facility] and was told they dont take medicaid. Placed amb referral to general surgery for evaluation. No need for repeat CMP today.

## 2023-02-11 NOTE — Assessment & Plan Note (Signed)
History of vitamin D insufficiency with vitamin D level of 29 07/2021. -f/u vitamin D level

## 2023-02-11 NOTE — Assessment & Plan Note (Addendum)
Patient has a history of moderate persistent asthma.On trellegy ellipta in the past, not currently taking. No PFTs in the chart, may consider getting these in the future. Only using albuterol 1-2 x monthly. Says maybe 3 times weekly having a little cough, chest tightness, wheezing with exertion/increased activity. Denies night time awakenings. Denies seasonal allergies. Denies hospitalization for asthma or asthma attack. Albuterol okay to continue using prn, could consider symbicort for prn inhaler use in the future. Today satting well, normal wob, lctab.

## 2023-02-11 NOTE — Progress Notes (Deleted)
Mod persistent asthma Albuterol  ANA 2016 Titer 1:160 speckled Neg [place] in 2016  BPD  Fibromyalgia  IDA  Vitamin D deficiency  Thallasemia trait  ED [id number] biliary colic Her ultrasound does show the presence of gallstones without pericholecystic fluid or gallbladder wall thickening.  Gen surg referral

## 2023-02-11 NOTE — Patient Instructions (Signed)
Thank you, Ms.Caytlin ZENITH DISS for allowing [place] to provide your care today. Today we discussed :  You have a history of iron deficiency and low vitamin D, we will check these today. Every one should get hepatitis C virus screening once, we will do this today. We will schedule you to get a pap smear with a female provider.     I have ordered the following labs for you:   Lab Orders         Iron, TIBC and Ferritin Panel         Vitamin D (25 hydroxy)         Hepatitis C Ab reflex to Quant PCR        Referrals ordered today:    Referral Orders         Ambulatory referral to General Surgery        Follow up: 3 months     We look forward to seeing you next time. Please call our clinic at [phone] if you have any questions or concerns. The best time to call is Monday-Friday from 9am-4pm, but there is someone available 24/7. If after hours or the weekend, call the main hospital number and ask for the Internal Medicine Resident On-Call. If you need medication refills, please notify your pharmacy one week in advance and they will send [place] a request.   Thank you for trusting me with your care. Wishing you the best!   Iona Coach, MD [place]

## 2023-02-11 NOTE — Assessment & Plan Note (Addendum)
Patient reports 11 year history of fibromyalgia. She describes whole body pains in her upper arms, shoulders, back and legs. She also has mixed insomnia with trouble falling and staying asleep. She says her mood is good. Has trialed gabapentin, muscle relaxers(robaxin), amitriptyline,, hydrocodone, ibuprofen with no relief. Does not think she has tried cymbalta or lyrica. On exam has diffuse tenderness to the shoulders and upper/lower back. Offered cymbalta vs lyrica, patient wishes to think about these and discuss in the future. Patient has been labeled with bipolar although I am not sure this diagnosis has ever been formally made as patient denies it. Only consideration would be starting lyrica over cymbalta if she did have bipolar not on mood stabilizer.

## 2023-02-11 NOTE — Assessment & Plan Note (Signed)
Patient reports she has never been diagnosed with bipolar disorder, has never been hospitalized for it and has never been treated for it. Upon chart review it appears this has been carried forward in problem lists but I do not see any notes/encounters related to this. Patient endorses she has seen a psychiatrist in the past for PTSD. When asked if she has heard of bipolar she says yes, but she does not know where providers got that label. I highly doubt without history of treatment or hospitalization that she has BPD1. Mood and thought processes normal today, speech/tone/volume normal.

## 2023-02-11 NOTE — Progress Notes (Signed)
New Patient Office Visit  Subjective    Patient ID: Caitlin Hartman, female    DOB: 09/05/1991  Age: 32 y.o. MRN: [id number]  CC:  Chief Complaint  Patient presents with   Cholelithiasis    GALL STONES    HPI SAGA HALPAIN is a 32 y/o female with a pmh outlined below who presents to establish care.  Outpatient Encounter Medications as of 02/11/2023  Medication Sig   albuterol (VENTOLIN HFA) 108 (90 Base) MCG/ACT inhaler Inhale 1-2 puffs into the lungs every 4 (four) hours as needed for wheezing or shortness of breath.   albuterol (VENTOLIN HFA) 108 (90 Base) MCG/ACT inhaler Inhale 2 puffs into the lungs every 6 (six) hours as needed for wheezing or shortness of breath.   budesonide-formoterol (SYMBICORT) 80-4.5 MCG/ACT inhaler Inhale 2 puffs into the lungs 2 (two) times daily. (Patient not taking: Reported on 01/18/2023)   Cholecalciferol (VITAMIN D) 2000 units CAPS Take 1 capsule (2,000 Units total) by mouth daily. (Patient not taking: Reported on 01/18/2023)   dicyclomine (BENTYL) 20 MG tablet Take 1 tablet (20 mg total) by mouth every 12 (twelve) hours as needed (for abdominal pain/cramping).   famotidine (PEPCID) 20 MG tablet Take 1 tablet (20 mg total) by mouth 2 (two) times daily for 14 days.   fluticasone (FLONASE) 50 MCG/ACT nasal spray Place 1 spray into both nostrils daily. (Patient not taking: Reported on 01/18/2023)   gabapentin (NEURONTIN) 100 MG capsule Take 1 capsule (100 mg total) by mouth 2 (two) times daily. (Patient not taking: Reported on 01/18/2023)   ibuprofen (ADVIL) 800 MG tablet Take 1 tablet (800 mg total) by mouth 3 (three) times daily. (Patient not taking: Reported on 01/18/2023)   methocarbamol (ROBAXIN) 500 MG tablet Take 1 tablet (500 mg total) by mouth 2 (two) times daily. (Patient not taking: Reported on 01/18/2023)   metroNIDAZOLE (FLAGYL) 500 MG tablet Take 1 tablet (500 mg total) by mouth 2 (two) times daily. (Patient not taking: Reported on 01/18/2023)    naproxen (NAPROSYN) 250 MG tablet Take 250 mg by mouth 2 (two) times daily as needed for moderate pain.   Propylene Glycol 0.95 % SOLN Apply 1-2 drops to eye 3 (three) times daily as needed. (Patient not taking: Reported on 01/18/2023)   tinidazole (TINDAMAX) 500 MG tablet Take 4 tablets (2,000 mg total) by mouth daily with breakfast. For two days (Patient not taking: Reported on 01/18/2023)   No facility-administered encounter medications on file as of 02/11/2023.    Past Medical History:  Diagnosis Date   Anemia    Asthma    Dyspnea    Fibromyalgia    Headache    Hypertension    Lyme disease 2013   Mental disorder    Rheumatoid aortitis 2014   pt states dx'd by ED doctor, has not followed up    Vaginal Pap smear, abnormal     Past Surgical History:  Procedure Laterality Date   INDUCED ABORTION     x 3    TONSILLECTOMY     WISDOM TOOTH EXTRACTION      Family History  Problem Relation Age of Onset   Alcohol abuse Mother    Drug abuse Mother    Depression Mother    Mental illness Mother    Drug abuse Father    Cancer Maternal Aunt    Breast cancer Maternal Aunt    Diabetes Maternal Uncle    Alcohol abuse Maternal Grandmother    Cancer Maternal  Grandmother    Diabetes Maternal Grandmother    Varicose Veins Maternal Grandmother    Drug abuse Maternal Grandfather    Cancer Paternal Grandmother    Breast cancer Paternal Grandmother     Social History   Socioeconomic History   Marital status: Married    Spouse name: Not on file   Number of children: Not on file   Years of education: Not on file   Highest education level: Not on file  Occupational History   Occupation: unemployed  Tobacco Use   Smoking status: Never   Smokeless tobacco: Never  Vaping Use   Vaping Use: Never used  Substance and Sexual Activity   Alcohol use: No   Drug use: Yes    Frequency: 3.0 times per week    Types: Marijuana    Comment: pt reports using marijuana x3/ wk for her  fibromyalgia   Sexual activity: Yes    Partners: Male    Birth control/protection: None, I.U.D.  Other Topics Concern   Not on file  Social History Narrative   Not on file   Social Determinants of Health   Financial Resource Strain: Low Risk  (01/21/2023)   Overall Financial Resource Strain (CARDIA)    Difficulty of Paying Living Expenses: Not very hard  Food Insecurity: No Food Insecurity (01/21/2023)   Hunger Vital Sign    Worried About Running Out of Food in the Last Year: Never true    Ran Out of Food in the Last Year: Never true  Transportation Needs: No Transportation Needs (01/21/2023)   PRAPARE - Hydrologist (Medical): No    Lack of Transportation (Non-Medical): No  Physical Activity: Not on file  Stress: Not on file  Social Connections: Not on file  Intimate Partner Violence: Not At Risk (01/21/2023)   Humiliation, Afraid, Rape, and Kick questionnaire    Fear of Current or Ex-Partner: No    Emotionally Abused: No    Physically Abused: No    Sexually Abused: No    Review of Systems  All other systems reviewed and are negative.       Objective    BP 130/66 (BP Location: Right Arm, Patient Position: Sitting, Cuff Size: Normal)   Pulse 73   Temp 97.9 F (36.6 C) (Oral)   Ht '5\' 9"'$  (1.753 m)   Wt 217 lb (98.4 kg)   LMP 02/06/2023   SpO2 100%   BMI 32.05 kg/m   Physical Exam Constitutional:      General: She is not in acute distress.    Appearance: Normal appearance. She is obese. She is not ill-appearing.  Eyes:     General: No scleral icterus.    Conjunctiva/sclera: Conjunctivae normal.  Cardiovascular:     Rate and Rhythm: Normal rate and regular rhythm.     Pulses: Normal pulses.     Heart sounds: Normal heart sounds. No murmur heard.    No gallop.  Pulmonary:     Effort: Pulmonary effort is normal. No respiratory distress.     Breath sounds: Normal breath sounds. No wheezing or rales.  Abdominal:     General: Abdomen  is flat. Bowel sounds are normal. There is no distension.     Palpations: Abdomen is soft. There is no mass.     Tenderness: There is no guarding or rebound.     Hernia: No hernia is present.     Comments: Pain to deep palpation of the RUQ, murphy sign positive  Musculoskeletal:     Right lower leg: No edema.     Left lower leg: No edema.  Skin:    General: Skin is warm and dry.     Capillary Refill: Capillary refill takes less than 2 seconds.     Coloration: Skin is not jaundiced.  Neurological:     Mental Status: She is alert.  Psychiatric:        Mood and Affect: Mood normal.        Behavior: Behavior normal.        Thought Content: Thought content normal.        Judgment: Judgment normal.         Assessment & Plan:   Problem List Items Addressed This Visit       Respiratory   Asthma, moderate persistent    Patient has a history of moderate persistent asthma.On trellegy ellipta in the past, not currently taking. No PFTs in the chart, may consider getting these in the future. Only using albuterol 1-2 x monthly. Says maybe 3 times weekly having a little cough, chest tightness, wheezing with exertion/increased activity. Denies night time awakenings. Denies seasonal allergies. Denies hospitalization for asthma or asthma attack. Albuterol okay to continue using prn, could consider symbicort for prn inhaler use in the future. Today satting well, normal wob, lctab.        Digestive   Cholelithiasis    Patient recently went to the ED for abdominal pain and was found to have cholelithiasis on RUQ. CMP wnl at this time, no fever, no juandice. Consistent with non-obstructive cholelithiasis without cholecystitis. Continues to have intermittent pain. Still no fever or chills, no juandice to suggest cholecystitis or obstruction. No relationship to food. On exam minimal RUQ ptp but murphy positive. Called [facility] and was told they dont take medicaid. Placed amb referral to general  surgery for evaluation. No need for repeat CMP today.      Relevant Orders   Ambulatory referral to General Surgery     Other   Fibromyalgia    Patient reports 11 year history of fibromyalgia. She describes whole body pains in her upper arms, shoulders, back and legs. She also has mixed insomnia with trouble falling and staying asleep. She says her mood is good. Has trialed gabapentin, muscle relaxers(robaxin), amitriptyline,, hydrocodone, ibuprofen with no relief. Does not think she has tried cymbalta or lyrica. On exam has diffuse tenderness to the shoulders and upper/lower back. Offered cymbalta vs lyrica, patient wishes to think about these and discuss in the future. Patient has been labeled with bipolar although I am not sure this diagnosis has ever been formally made as patient denies it. Only consideration would be starting lyrica over cymbalta if she did have bipolar not on mood stabilizer.      Low vitamin D level    History of vitamin D insufficiency with vitamin D level of 29 07/2021. -f/u vitamin D level      Relevant Orders   Vitamin D (25 hydroxy)   Iron deficiency anemia - Primary   Relevant Orders   Iron, TIBC and Ferritin Panel   Microcytosis    Per OBGYN note 08/2017 electrophoresis normal and not consistent with thalassemia trait. Recent CBC 01/2023 with no anemia but did have microcytosis. Will f/u iron studies today, do not want to miss any iron deficiency.      Healthcare maintenance    Patient to follow up in the next 3 months for pap smear with female provider.  HCV screening done today.      Other Visit Diagnoses     Encounter for HCV screening test for low risk patient       Relevant Orders   Hepatitis C Ab reflex to Quant PCR       Return in about 3 months (around 05/14/2023) for Pap smear with female provider.   Iona Coach, MD

## 2023-02-11 NOTE — Assessment & Plan Note (Signed)
Patient to follow up in the next 3 months for pap smear with female provider. HCV screening done today.

## 2023-02-12 NOTE — Progress Notes (Signed)
[facility] Medicine Clinic Attending  Case discussed with Dr. Stann Mainland  At the time of the visit.  We reviewed the resident's history and exam and pertinent patient test results.  I agree with the assessment, diagnosis, and plan of care documented in the resident's note.

## 2023-02-13 LAB — IRON,TIBC AND FERRITIN PANEL
Ferritin: 28 ng/mL (ref 15–150)
Iron Saturation: 40 % (ref 15–55)
Iron: 92 ug/dL (ref 27–159)
Total Iron Binding Capacity: 231 ug/dL — ABNORMAL LOW (ref 250–450)
UIBC: 139 ug/dL (ref 131–425)

## 2023-02-13 LAB — VITAMIN D 25 HYDROXY (VIT D DEFICIENCY, FRACTURES): Vit D, 25-Hydroxy: 11.8 ng/mL — ABNORMAL LOW (ref 30.0–100.0)

## 2023-02-13 LAB — HCV INTERPRETATION

## 2023-02-13 LAB — HCV AB W REFLEX TO QUANT PCR: HCV Ab: NONREACTIVE

## 2023-02-13 NOTE — Progress Notes (Signed)
Called and discussed lab results with patient. No evidence of iron deficiency anemia at this time. HCV and HIV screening negative. Vitamin D deficient with level <12, will take 50K u once weekly x 8 weeks, then 800u daily. Will recheck level in 3-4 months.

## 2023-02-18 NOTE — Progress Notes (Unsigned)
Patient ID: Caitlin Hartman, female   DOB: Jul 22, 1991, 32 y.o.   MRN: [id number]  Chief Complaint: Right upper quadrant pain  History of Present Illness Caitlin Hartman is a 32 y.o. female with history of pain off and on for few years, very brief in duration.  Recently was seen in the ER due to severe pain that was unrelenting.  She does not recall what meal or type of food may have provoked this.  She reports it was not a meal out of the ordinary.  And she also admits she enjoys butter on or in a lot of her foods.  She denies any recent history of fever or chills, denies nausea or diarrhea.  She has pain off and on but is bearable.  She reports that she avoids fried foods, things that are spicy.  No history of hepatitis or jaundice.  Past Medical History Past Medical History:  Diagnosis Date   Anemia    Asthma    Dyspnea    Fibromyalgia    Headache    Hypertension    Lyme disease 2013   Mental disorder    Rheumatoid aortitis 2014   pt states dx'd by ED doctor, has not followed up    Vaginal Pap smear, abnormal       Past Surgical History:  Procedure Laterality Date   INDUCED ABORTION     x 3    TONSILLECTOMY     WISDOM TOOTH EXTRACTION      Allergies  Allergen Reactions   Latex Hives   Oxycodone-Acetaminophen Nausea And Vomiting    Current Outpatient Medications  Medication Sig Dispense Refill   albuterol (VENTOLIN HFA) 108 (90 Base) MCG/ACT inhaler Inhale 2 puffs into the lungs every 6 (six) hours as needed for wheezing or shortness of breath.     albuterol (VENTOLIN HFA) 108 (90 Base) MCG/ACT inhaler Inhale 1-2 puffs into the lungs every 4 (four) hours as needed for wheezing or shortness of breath. 6.7 g 2   No current facility-administered medications for this visit.    Family History Family History  Problem Relation Age of Onset   Alcohol abuse Mother    Drug abuse Mother    Depression Mother    Mental illness Mother    Drug abuse Father    Cancer Maternal  Aunt    Breast cancer Maternal Aunt    Diabetes Maternal Uncle    Alcohol abuse Maternal Grandmother    Cancer Maternal Grandmother    Diabetes Maternal Grandmother    Varicose Veins Maternal Grandmother    Drug abuse Maternal Grandfather    Cancer Paternal Grandmother    Breast cancer Paternal Grandmother       Social History Social History   Tobacco Use   Smoking status: Never    Passive exposure: Never   Smokeless tobacco: Never  Vaping Use   Vaping Use: Never used  Substance Use Topics   Alcohol use: No   Drug use: Yes    Frequency: 3.0 times per week    Types: Marijuana    Comment: pt reports using marijuana x3/ wk for her fibromyalgia        Review of Systems  Constitutional: Negative.   HENT: Negative.    Eyes: Negative.   Respiratory: Negative.    Cardiovascular: Negative.   Gastrointestinal:  Positive for abdominal pain. Negative for blood in stool, diarrhea, melena, nausea and vomiting.  Genitourinary: Negative.   Skin: Negative.   Neurological: Negative.  Psychiatric/Behavioral: Negative.       Physical Exam Blood pressure 123/84, pulse 64, temperature 98 F (36.7 C), height '5\' 9"'$  (1.753 m), weight 213 lb (96.6 kg), last menstrual period 02/06/2023, SpO2 100 %. Last Weight  Most recent update: 02/20/2023 10:24 AM    Weight  96.6 kg (213 lb)             CONSTITUTIONAL: Well developed, and nourished, appropriately responsive and aware without distress.   EYES: Sclera non-icteric.   EARS, NOSE, MOUTH AND THROAT:  The oropharynx is clear. Oral mucosa is pink and moist.    Hearing is intact to voice.  NECK: Trachea is midline, and there is no jugular venous distension.  LYMPH NODES:  Lymph nodes in the neck are not appreciated. RESPIRATORY:  Lungs are clear, and breath sounds are equal bilaterally.  Normal respiratory effort without pathologic use of accessory muscles. CARDIOVASCULAR: Heart is regular in rate and rhythm.   Well perfused.  GI: The  abdomen is  soft, nontender, and nondistended. There were no palpable masses.  I did not appreciate hepatosplenomegaly. MUSCULOSKELETAL:  Symmetrical muscle tone appreciated in all four extremities.    SKIN: Skin turgor is normal. No pathologic skin lesions appreciated.  NEUROLOGIC:  Motor and sensation appear grossly normal.  Cranial nerves are grossly without defect. PSYCH:  Alert and oriented to person, place and time. Affect is appropriate for situation.  Data Reviewed I have personally reviewed what is currently available of the patient's imaging, recent labs and medical records.   Labs:     Latest Ref Rng & Units 01/18/2023   10:20 PM 12/30/2018   10:59 AM 02/25/2018   11:18 AM  CBC  WBC 4.0 - 10.5 K/uL 7.5  4.6  4.0   Hemoglobin 12.0 - 15.0 g/dL 12.0  11.4  11.8   Hematocrit 36.0 - 46.0 % 38.0  36.7  37.0   Platelets 150 - 400 K/uL 336  240  185       Latest Ref Rng & Units 01/18/2023   10:20 PM 12/30/2018   10:59 AM 03/26/2017   12:49 PM  CMP  Glucose 70 - 99 mg/dL 90  91  77   BUN 6 - 20 mg/dL '9  6  7   '$ Creatinine 0.44 - 1.00 mg/dL 0.97  0.96  0.66   Sodium 135 - 145 mmol/L 137  136  135   Potassium 3.5 - 5.1 mmol/L 3.5  3.8  3.6   Chloride 98 - 111 mmol/L 101  106  104   CO2 22 - 32 mmol/L '25  24  22   '$ Calcium 8.9 - 10.3 mg/dL 8.7  8.5  9.3   Total Protein 6.5 - 8.1 g/dL 8.0  6.1  6.7   Total Bilirubin 0.3 - 1.2 mg/dL 0.5  0.7  0.7   Alkaline Phos 38 - 126 U/L 75  47  29   AST 15 - 41 U/L '18  27  18   '$ ALT 0 - 44 U/L 18  33  19    Imaging: Radiological images reviewed:  CLINICAL DATA:  Right upper quadrant abdominal pain.   EXAM: ULTRASOUND ABDOMEN LIMITED RIGHT UPPER QUADRANT   COMPARISON:  None Available.   FINDINGS: Gallbladder:   There is stones and sludge within the gallbladder. No gallbladder wall thickening or pericholecystic fluid. Negative sonographic Murphy's sign.   Common bile duct:   Diameter: 1 mm   Liver:   The liver is unremarkable.  Portal vein is patent on color Doppler imaging with normal direction of blood flow towards the liver.   Other: None.   IMPRESSION: Cholelithiasis without sonographic evidence of acute cholecystitis.     Electronically Signed   By: Anner Crete M.D.   On: 01/18/2023 23:00 Within last 24 hrs: No results found.  Assessment    Chronic calculus cholecystitis with biliary colic  Patient Active Problem List   Diagnosis Date Noted   Microcytosis 02/11/2023   Cholelithiasis 02/11/2023   Healthcare maintenance 02/11/2023   Multiple joint pain 10/17/2020   Carpal tunnel syndrome 05/09/2020   Bacterial vaginosis 01/11/2020   Need for history and physical examination for employment 09/02/2019   IUD (intrauterine device) in place 03/01/2018   Breast mass 11/08/2017   Bipolar disorder ([place]) 11/08/2017   PTSD (post-traumatic stress disorder) 11/08/2017   Iron deficiency anemia 09/11/2017   ANA positive 04/16/2017   Low vitamin D level 04/16/2017   Fibromyalgia 04/07/2017   Asthma, moderate persistent 01/31/2017   Thalassemia trait 08/03/2015   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 This was discussed thoroughly.  Optimal plan is for robotic cholecystectomy utilizing ICG imaging. Risks and benefits have been discussed with the patient which include but are not limited to anesthesia, bleeding, infection, biliary ductal injury, resulting in leak or stenosis, other associated unanticipated injuries affiliated with laparoscopic surgery.   Reviewed that removing the gallbladder will only address the symptoms related to the gallbladder itself.  I believe there is the desire to proceed, accepting the risks with understanding.  Questions elicited and answered to satisfaction.    No guarantees ever expressed or implied.   Face-to-face time spent with the patient and accompanying care providers(if present) was 30 minutes, with more than 50% of the  time spent counseling, educating, and coordinating care of the patient.    These notes generated with voice recognition software. I apologize for typographical errors.  Ronny Bacon M.D., FACS 02/20/2023, 11:11 AM

## 2023-02-20 ENCOUNTER — Ambulatory Visit (INDEPENDENT_AMBULATORY_CARE_PROVIDER_SITE_OTHER): Payer: Medicaid Other | Admitting: Surgery

## 2023-02-20 ENCOUNTER — Encounter: Payer: Self-pay | Admitting: Surgery

## 2023-02-20 ENCOUNTER — Ambulatory Visit: Payer: Self-pay | Admitting: Surgery

## 2023-02-20 VITALS — BP 123/84 | HR 64 | Temp 98.0°F | Ht 69.0 in | Wt 213.0 lb

## 2023-02-20 DIAGNOSIS — K8010 Calculus of gallbladder with chronic cholecystitis without obstruction: Secondary | ICD-10-CM

## 2023-02-20 NOTE — Patient Instructions (Addendum)
You have requested to have your gallbladder removed. This will be done at [facility] with Dr. Rodenberg.  You will most likely be out of work 1-2 weeks for this surgery.  If you have FMLA or disability paperwork that needs filled out you may drop this off at our office or this can be faxed to [phone].  You will return after your post-op appointment with a lifting restriction for approximately 4 more weeks.  You will be able to eat anything you would like to following surgery. But, start by eating a bland diet and advance this as tolerated. The Gallbladder diet is below, please go as closely by this diet as possible prior to surgery to avoid any further attacks.  Please see the (blue)pre-care form that you have been given today. Our surgery scheduler will call you to verify surgery date and to go over information.   If you have any questions, please call our office.  Laparoscopic Cholecystectomy Laparoscopic cholecystectomy is surgery to remove the gallbladder. The gallbladder is located in the upper right part of the abdomen, behind the liver. It is a storage sac for bile, which is produced in the liver. Bile aids in the digestion and absorption of fats. Cholecystectomy is often done for inflammation of the gallbladder (cholecystitis). This condition is usually caused by a buildup of gallstones (cholelithiasis) in the gallbladder. Gallstones can block the flow of bile, and that can result in inflammation and pain. In severe cases, emergency surgery may be required. If emergency surgery is not required, you will have time to prepare for the procedure. Laparoscopic surgery is an alternative to open surgery. Laparoscopic surgery has a shorter recovery time. Your common bile duct may also need to be examined during the procedure. If stones are found in the common bile duct, they may be removed. LET YOUR HEALTH CARE PROVIDER KNOW ABOUT: Any allergies you have. All medicines you are taking,  including vitamins, herbs, eye drops, creams, and over-the-counter medicines. Previous problems you or members of your family have had with the use of anesthetics. Any blood disorders you have. Previous surgeries you have had.  Any medical conditions you have. RISKS AND COMPLICATIONS Generally, this is a safe procedure. However, problems may occur, including: Infection. Bleeding. Allergic reactions to medicines. Damage to other structures or organs. A stone remaining in the common bile duct. A bile leak from the cyst duct that is clipped when your gallbladder is removed. The need to convert to open surgery, which requires a larger incision in the abdomen. This may be necessary if your surgeon thinks that it is not safe to continue with a laparoscopic procedure. BEFORE THE PROCEDURE Ask your health care provider about: Changing or stopping your regular medicines. This is especially important if you are taking diabetes medicines or blood thinners. Taking medicines such as aspirin and ibuprofen. These medicines can thin your blood. Do not take these medicines before your procedure if your health care provider instructs you not to. Follow instructions from your health care provider about eating or drinking restrictions. Let your health care provider know if you develop a cold or an infection before surgery. Plan to have someone take you home after the procedure. Ask your health care provider how your surgical site will be marked or identified. You may be given antibiotic medicine to help prevent infection. PROCEDURE To reduce your risk of infection: Your health care team will wash or sanitize their hands. Your skin will be washed with soap. An IV   tube may be inserted into one of your veins. You will be given a medicine to make you fall asleep (general anesthetic). A breathing tube will be placed in your mouth. The surgeon will make several small cuts (incisions) in your abdomen. A thin,  lighted tube (laparoscope) that has a tiny camera on the end will be inserted through one of the small incisions. The camera on the laparoscope will send a picture to a TV screen (monitor) in the operating room. This will give the surgeon a good view inside your abdomen. A gas will be pumped into your abdomen. This will expand your abdomen to give the surgeon more room to perform the surgery. Other tools that are needed for the procedure will be inserted through the other incisions. The gallbladder will be removed through one of the incisions. After your gallbladder has been removed, the incisions will be closed with stitches (sutures), staples, or skin glue. Your incisions may be covered with a bandage (dressing). The procedure may vary among health care providers and hospitals. AFTER THE PROCEDURE Your blood pressure, heart rate, breathing rate, and blood oxygen level will be monitored often until the medicines you were given have worn off. You will be given medicines as needed to control your pain.   This information is not intended to replace advice given to you by your health care provider. Make sure you discuss any questions you have with your health care provider.   Document Released: 11/25/2005 Document Revised: 08/16/2015 Document Reviewed: 07/07/2013 Elsevier Interactive Patient Education 2016 [employer] Inc.   Low-Fat Diet for Gallbladder Conditions A low-fat diet can be helpful if you have pancreatitis or a gallbladder condition. With these conditions, your pancreas and gallbladder have trouble digesting fats. A healthy eating plan with less fat will help rest your pancreas and gallbladder and reduce your symptoms. WHAT DO I NEED TO KNOW ABOUT THIS DIET? Eat a low-fat diet. Reduce your fat intake to less than 20-30% of your total daily calories. This is less than 50-60 g of fat per day. Remember that you need some fat in your diet. Ask your dietician what your daily goal should  be. Choose nonfat and low-fat healthy foods. Look for the words "nonfat," "low fat," or "fat free." As a guide, look on the label and choose foods with less than 3 g of fat per serving. Eat only one serving. Avoid alcohol. Do not smoke. If you need help quitting, talk with your health care provider. Eat small frequent meals instead of three large heavy meals. WHAT FOODS CAN I EAT? Grains Include healthy grains and starches such as potatoes, wheat bread, fiber-rich cereal, and brown rice. Choose whole grain options whenever possible. In adults, whole grains should account for 45-65% of your daily calories.  Fruits and Vegetables Eat plenty of fruits and vegetables. Fresh fruits and vegetables add fiber to your diet. Meats and Other Protein Sources Eat lean meat such as chicken and pork. Trim any fat off of meat before cooking it. Eggs, fish, and beans are other sources of protein. In adults, these foods should account for 10-35% of your daily calories. Dairy Choose low-fat milk and dairy options. Dairy includes fat and protein, as well as calcium.  Fats and Oils Limit high-fat foods such as fried foods, sweets, baked goods, sugary drinks.  Other Creamy sauces and condiments, such as mayonnaise, can add extra fat. Think about whether or not you need to use them, or use smaller amounts or low fat options.   WHAT FOODS ARE NOT RECOMMENDED? High fat foods, such as: Baked goods. Ice cream. French toast. Sweet rolls. Pizza. Cheese bread. Foods covered with batter, butter, creamy sauces, or cheese. Fried foods. Sugary drinks and desserts. Foods that cause gas or bloating   This information is not intended to replace advice given to you by your health care provider. Make sure you discuss any questions you have with your health care provider.   Document Released: 11/30/2013 Document Reviewed: 11/30/2013 Elsevier Interactive Patient Education 2016 Elsevier Inc.   

## 2023-02-21 ENCOUNTER — Telehealth: Payer: Self-pay | Admitting: Surgery

## 2023-02-21 NOTE — Telephone Encounter (Signed)
Left message for patient to call, please inform her of the following regarding scheduled surgery with Dr. Christian Mate.   Pre-Admission date/time, and Surgery date at [facility].  Surgery Date: 03/07/23 Preadmission Testing Date: 02/26/23 (phone 8a-1p)  Also patient will need to call at [phone], between 1-3:00pm the day before surgery, to find out what time to arrive for surgery.

## 2023-02-24 NOTE — Telephone Encounter (Signed)
Called patient back, she is now informed of all dates regarding her surgery.  

## 2023-02-26 ENCOUNTER — Encounter
Admission: RE | Admit: 2023-02-26 | Discharge: 2023-02-26 | Disposition: A | Discharge status: Home / Self Care | Payer: Medicaid Other | Source: Ambulatory Visit | Attending: Surgery | Admitting: Surgery

## 2023-02-26 VITALS — Ht 69.0 in | Wt 210.0 lb

## 2023-02-26 DIAGNOSIS — Z01812 Encounter for preprocedural laboratory examination: Secondary | ICD-10-CM

## 2023-02-26 HISTORY — DX: Iron deficiency anemia, unspecified: D50.9

## 2023-02-26 HISTORY — DX: Calculus of gallbladder with chronic cholecystitis without obstruction: K80.10

## 2023-02-26 HISTORY — DX: Vitamin D deficiency, unspecified: E55.9

## 2023-02-26 HISTORY — DX: Moderate persistent asthma, uncomplicated: J45.40

## 2023-02-26 NOTE — Patient Instructions (Addendum)
Your procedure is scheduled on: Friday, March 29 Report to the Registration Desk on the 1st floor of the [facility]. To find out your arrival time, please call [phone] between 1PM - 3PM on: Thursday, March 28 If your arrival time is 6:00 am, do not arrive before that time as the [facility] entrance doors do not open until 6:00 am.  REMEMBER: Instructions that are not followed completely may result in serious medical risk, up to and including death; or upon the discretion of your surgeon and anesthesiologist your surgery may need to be rescheduled.  Do not eat or drink after midnight the night before surgery.  No gum chewing or hard candies.  One week prior to surgery: starting March 22 Stop Anti-inflammatories (NSAIDS) such as Advil, Aleve, Ibuprofen, Motrin, Naproxen, Naprosyn and Aspirin based products such as Excedrin, Goody's Powder, BC Powder. Stop ANY OVER THE COUNTER supplements until after surgery. You may however, continue to take Tylenol if needed for pain up until the day of surgery.  TAKE ONLY THESE MEDICATIONS THE MORNING OF SURGERY:  Albuterol inhaler  Use inhaler on the day of surgery and bring to the hospital.  No Alcohol for 24 hours before or after surgery.  No Smoking including e-cigarettes for 24 hours before surgery.  No chewable tobacco products for at least 6 hours before surgery.  No nicotine patches on the day of surgery.  Do not use any "recreational" drugs for at least a week (preferably 2 weeks) before your surgery.  Please be advised that the combination of cocaine and anesthesia may have negative outcomes, up to and including death. If you test positive for cocaine, your surgery will be cancelled.  On the morning of surgery brush your teeth with toothpaste and water, you may rinse your mouth with mouthwash if you wish. Do not swallow any toothpaste or mouthwash.  Use CHG Soap as directed on instruction sheet.  Do not wear jewelry, make-up,  hairpins, clips or nail polish.  Do not wear lotions, powders, or perfumes.   Do not shave body hair from the neck down 48 hours before surgery.  Contact lenses, hearing aids and dentures may not be worn into surgery.  Do not bring valuables to the hospital. [facility] is not responsible for any missing/lost belongings or valuables.   Notify your doctor if there is any change in your medical condition (cold, fever, infection).  Wear comfortable clothing (specific to your surgery type) to the hospital.  After surgery, you can help prevent lung complications by doing breathing exercises.  Take deep breaths and cough every 1-2 hours. Your doctor may order a device called an Incentive Spirometer to help you take deep breaths. When coughing or sneezing, hold a pillow firmly against your incision with both hands. This is called "splinting." Doing this helps protect your incision. It also decreases belly discomfort.  If you are being discharged the day of surgery, you will not be allowed to drive home. You will need a responsible individual to drive you home and stay with you for 24 hours after surgery.   If you are taking public transportation, you will need to have a responsible individual with you.  Please call the [facility] at [phone] if you have any questions about these instructions.  Surgery Visitation Policy:  Patients undergoing a surgery or procedure may have two family members or support persons with them as long as the person is not COVID-19 positive or experiencing its symptoms.  Preparing for Surgery with CHLORHEXIDINE GLUCONATE (CHG) Soap  Chlorhexidine Gluconate (CHG) Soap  o An antiseptic cleaner that kills germs and bonds with the skin to continue killing germs even after washing  o Used for showering the night before surgery and morning of surgery  Before surgery, you can play an important role by reducing the number of germs on your  skin.  CHG (Chlorhexidine gluconate) soap is an antiseptic cleanser which kills germs and bonds with the skin to continue killing germs even after washing.  Please do not use if you have an allergy to CHG or antibacterial soaps. If your skin becomes reddened/irritated stop using the CHG.  1. Shower the NIGHT BEFORE SURGERY and the MORNING OF SURGERY with CHG soap.  2. If you choose to wash your hair, wash your hair first as usual with your normal shampoo.  3. After shampooing, rinse your hair and body thoroughly to remove the shampoo.  4. Use CHG as you would any other liquid soap. You can apply CHG directly to the skin and wash gently with a scrungie or a clean washcloth.  5. Apply the CHG soap to your body only from the neck down. Do not use on open wounds or open sores. Avoid contact with your eyes, ears, mouth, and genitals (private parts). Wash face and genitals (private parts) with your normal soap.  6. Wash thoroughly, paying special attention to the area where your surgery will be performed.  7. Thoroughly rinse your body with warm water.  8. Do not shower/wash with your normal soap after using and rinsing off the CHG soap.  9. Pat yourself dry with a clean towel.  10. Wear clean pajamas to bed the night before surgery.  12. Place clean sheets on your bed the night of your first shower and do not sleep with pets.  13. Shower again with the CHG soap on the day of surgery prior to arriving at the hospital.  14. Do not apply any deodorants/lotions/powders.  15. Please wear clean clothes to the hospital.

## 2023-03-07 ENCOUNTER — Encounter: Admission: RE | Payer: Self-pay | Source: Home / Self Care

## 2023-03-07 ENCOUNTER — Ambulatory Visit: Admission: RE | Admit: 2023-03-07 | Payer: Medicaid Other | Source: Home / Self Care | Admitting: Surgery

## 2023-03-07 SURGERY — CHOLECYSTECTOMY, ROBOT-ASSISTED, LAPAROSCOPIC
Anesthesia: General

## 2023-03-17 ENCOUNTER — Ambulatory Visit
Admission: EM | Admit: 2023-03-17 | Discharge: 2023-03-17 | Disposition: A | Discharge status: Home / Self Care | Payer: Medicaid Other | Attending: Internal Medicine | Admitting: Internal Medicine

## 2023-03-17 ENCOUNTER — Ambulatory Visit (INDEPENDENT_AMBULATORY_CARE_PROVIDER_SITE_OTHER): Payer: Medicaid Other

## 2023-03-17 DIAGNOSIS — M79671 Pain in right foot: Secondary | ICD-10-CM | POA: Diagnosis not present

## 2023-03-17 MED ORDER — IBUPROFEN 800 MG PO TABS
800.0000 mg | ORAL_TABLET | Freq: Once | ORAL | Status: AC
  Administered 2023-03-17: 800 mg via ORAL

## 2023-03-17 MED ORDER — IBUPROFEN 800 MG PO TABS: 800.0000 mg | ORAL_TABLET | Freq: Three times a day (TID) | ORAL | 0 refills | Status: DC

## 2023-03-17 NOTE — ED Provider Notes (Signed)
[facility]    CSN: [id number] Arrival date & time: 03/17/23  [id number]      History   Chief Complaint Chief Complaint  Patient presents with   Ankle Injury    HPI Caitlin Hartman is a 32 y.o. female.   Patient presents to urgent care for evaluation of right lateral foot/heel pain that started yesterday when she was walking in the yard with her dog on grass and she stepped into a "dip" in the yard.  When stepping into the dip in the yard, she planted then twisted her right ankle/right foot subsequently resulting in pain to the lateral aspect of the right heel/plantar surface.  She states she has been limping since injury and has had to walk on the ball of her right foot to avoid pain.  She elevated the right foot last night to help with swelling with some relief.  Has not had any over-the-counter medications to help with pain.  Pain is mostly to the foot, denies ankle swelling and pain.  She did not fall after injury. Denies numbness/tingling distally, color change to the foot/toes, and lower extremity weakness. States she has had a fracture to this foot in the past.    Ankle Injury    Past Medical History:  Diagnosis Date   Anemia    Asthma, moderate persistent    Chronic cholecystitis with calculus    Dyspnea    Fibromyalgia    Headache    Hypertension    Iron deficiency anemia    Lyme disease 2013   Mental disorder    Rheumatoid aortitis 2014   pt states dx'd by ED doctor, has not followed up    Vaginal Pap smear, abnormal    Vitamin D deficiency     Patient Active Problem List   Diagnosis Date Noted   Microcytosis 02/11/2023   Cholelithiasis 02/11/2023   Healthcare maintenance 02/11/2023   Multiple joint pain 10/17/2020   Carpal tunnel syndrome 05/09/2020   Bacterial vaginosis 01/11/2020   Need for history and physical examination for employment 09/02/2019   IUD (intrauterine device) in place 03/01/2018   Breast mass 11/08/2017   Bipolar disorder  11/08/2017   PTSD (post-traumatic stress disorder) 11/08/2017   Iron deficiency anemia 09/11/2017   ANA positive 04/16/2017   Low vitamin D level 04/16/2017   Fibromyalgia 04/07/2017   Asthma, moderate persistent 01/31/2017   Thalassemia trait 08/03/2015   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 Authorizing Provider  ibuprofen (ADVIL) 800 MG tablet Take 1 tablet (800 mg total) by mouth 3 (three) times daily. 03/17/23  Yes Carlisle BeersStanhope, Jhade Berko M, FNP  albuterol (VENTOLIN HFA) 108 (90 Base) MCG/ACT inhaler Inhale 1-2 puffs into the lungs every 4 (four) hours as needed for wheezing  or shortness of breath. 09/01/19 02/26/23  Marthenia Rolling, DO    Family History Family History  Problem Relation Age of Onset   Alcohol abuse Mother    Drug abuse Mother    Depression Mother    Mental illness Mother    Drug abuse Father    Cancer Maternal Aunt    Breast cancer Maternal Aunt    Diabetes Maternal Uncle    Alcohol abuse Maternal Grandmother    Cancer Maternal Grandmother    Diabetes Maternal Grandmother    Varicose Veins Maternal Grandmother    Drug abuse Maternal Grandfather    Cancer Paternal Grandmother    Breast cancer Paternal Grandmother     Social History Social History   Tobacco Use   Smoking status: Never    Passive exposure: Never   Smokeless tobacco: Never  Vaping Use   Vaping Use: Never used  Substance Use Topics   Alcohol use: No   Drug use: Yes    Frequency: 3.0 times per week    Types: Marijuana    Comment: pt reports using marijuana x3/ wk for her  fibromyalgia     Allergies   Latex and Oxycodone-acetaminophen   Review of Systems Review of Systems Per HPI  Physical Exam Triage Vital Signs ED Triage Vitals [03/17/23 1016]  Enc Vitals Group     BP (!) 163/103     Pulse Rate 68     Resp 16     Temp 98 F (36.7 C)     Temp Source Oral     SpO2 98 %     Weight      Height      Head Circumference      Peak Flow      Pain Score 10     Pain Loc      Pain Edu?      Excl. in GC?    No data found.  Updated Vital Signs BP (!) 163/103 (BP Location: Left Arm)   Pulse 68   Temp 98 F (36.7 C) (Oral)   Resp 16   SpO2 98%   Visual Acuity Right Eye Distance:   Left Eye Distance:   Bilateral Distance:    Right Eye Near:   Left Eye Near:    Bilateral Near:     Physical Exam Vitals and nursing note reviewed.  Constitutional:      Appearance: She is not ill-appearing or toxic-appearing.  HENT:     Head: Normocephalic and atraumatic.     Right Ear: Hearing and external ear normal.     Left Ear: Hearing and external ear normal.     Nose: Nose normal.     Mouth/Throat:     Lips: Pink.  Eyes:     General: Lids are normal. Vision grossly intact. Gaze aligned appropriately.     Extraocular Movements: Extraocular movements intact.     Conjunctiva/sclera: Conjunctivae normal.  Pulmonary:     Effort: Pulmonary effort is normal.  Musculoskeletal:     Cervical back: Neck supple.     Right ankle: Normal. No swelling, deformity, ecchymosis or lacerations. No tenderness. Normal range of motion. Anterior drawer test negative. Normal pulse.     Right Achilles Tendon: Normal.     Left ankle: Normal.     Right foot: Normal range of motion and normal capillary refill. Tenderness (tenderness to the lateral plantar aspect of the foot near the lateral right heel.) present. No swelling, deformity, bunion, Charcot foot, foot  drop, prominent metatarsal heads, laceration, bony tenderness or crepitus. Normal pulse.       Feet:  Feet:      Comments: TTP to the area indicated above. Skin:    General: Skin is warm and dry.     Capillary Refill: Capillary refill takes less than 2 seconds.     Findings: No rash.  Neurological:     General: No focal deficit present.     Mental Status: She is alert and oriented to person, place, and time. Mental status is at baseline.     Cranial Nerves: No dysarthria or facial asymmetry.  Psychiatric:        Mood and Affect: Mood normal.        Speech: Speech normal.        Behavior: Behavior normal.        Thought Content: Thought content normal.        Judgment: Judgment normal.      UC Treatments / Results  Labs (all labs ordered are listed, but only abnormal results are displayed) Labs Reviewed - No data to display  EKG   Radiology DG Foot Complete Right  Result Date: 03/17/2023 CLINICAL DATA:  Injury, pain EXAM: RIGHT FOOT COMPLETE - 3+ VIEW COMPARISON:  09/10/2022 FINDINGS: There is no evidence of fracture or dislocation. There is no evidence of arthropathy or other focal bone abnormality. Soft tissues are unremarkable. IMPRESSION: Negative. Electronically Signed   By: Judie Petit.  Shick M.D.   On: 03/17/2023 10:39    Procedures Procedures (including critical care time)  Medications Ordered in UC Medications  ibuprofen (ADVIL) tablet 800 mg (800 mg Oral Given 03/17/23 1034)    Initial Impression / Assessment and Plan / UC Course  I have reviewed the triage vital signs and the nursing notes.  Pertinent labs & imaging results that were available during my care of the patient were reviewed by me and considered in my medical decision making (see chart for details).   1.  Acute pain of right foot Musculoskeletal exam is stable in clinic.  Neurovascularly intact distal to injury.  X-rays negative for signs of acute bony abnormality.  RICE advised.  May use ibuprofen 800 mg every 8 hours as needed for pain and inflammation.  Patient given dose of ibuprofen in clinic for acute pain. BP  initially elevated at 160s/100s, reduced to 133/95 at discharge. She does not take medications for high blood pressure, elevated BP likely secondary to pain. Offered crutches assist with ambulation, however patient declined stating that she has fibromyalgia and using crutches exacerbates pain to the axillary region bilaterally.  Postoperative shoe applied for support and stability.  Walking referral to orthopedics provided should symptoms fail to improve in the next 1 to 2 weeks. Work note given.   Discussed physical exam and available lab work findings in clinic with patient.  Counseled patient regarding appropriate use of medications and potential side effects for all medications recommended or prescribed today. Discussed red flag signs and symptoms of worsening condition,when to call the PCP office, return to urgent care, and when to seek higher level of care in the emergency department. Patient verbalizes understanding and agreement with plan. All questions answered. Patient discharged in stable condition.    Final Clinical Impressions(s) / UC Diagnoses   Final diagnoses:  Acute pain of right foot     Discharge Instructions      Your x-rays of your foot were negative for fracture or dislocation. You likely sprained your foot.  Wear supportive shoes to provide compression, stability, and comfort for the next few days.  Please rest, ice, and elevate your foot to help it heal and decrease inflammation.   Take 600mg  ibuprofen and/or 1,000mg  tylenol every 6 hours as needed for pain and inflammation. Take with food to avoid stomach upset.  Call the orthopedic provider listed on your discharge paperwork to schedule a follow-up appointment if your symptoms do not improve in the next 1-2 weeks with supportive care.  Return to urgent care if you experience worsening pain, numbness, tingling, change of color in your skin near the injury, or any other concerning symptoms.  I hope you feel  better!     ED Prescriptions     Medication Sig Dispense Auth. Provider   ibuprofen (ADVIL) 800 MG tablet Take 1 tablet (800 mg total) by mouth 3 (three) times daily. 21 tablet Carlisle Beers, FNP      PDMP not reviewed this encounter.   Carlisle Beers, [place] 03/17/23 1050

## 2023-03-17 NOTE — Discharge Instructions (Addendum)
Your x-rays of your foot were negative for fracture or dislocation. You likely sprained your foot.   Wear supportive shoes to provide compression, stability, and comfort for the next few days.  Please rest, ice, and elevate your foot to help it heal and decrease inflammation.   Take 600mg  ibuprofen and/or 1,000mg  tylenol every 6 hours as needed for pain and inflammation. Take with food to avoid stomach upset.  Call the orthopedic provider listed on your discharge paperwork to schedule a follow-up appointment if your symptoms do not improve in the next 1-2 weeks with supportive care.  Return to urgent care if you experience worsening pain, numbness, tingling, change of color in your skin near the injury, or any other concerning symptoms.  I hope you feel better!

## 2023-03-17 NOTE — ED Triage Notes (Signed)
Pt c/o walking outside yesterday and walked into a pothole in the yard and injured the right ankle. Pain in right ankle and foot.

## 2023-03-26 DIAGNOSIS — M25571 Pain in right ankle and joints of right foot: Secondary | ICD-10-CM | POA: Diagnosis not present

## 2023-03-26 DIAGNOSIS — S93491A Sprain of other ligament of right ankle, initial encounter: Secondary | ICD-10-CM | POA: Diagnosis not present

## 2023-04-09 DIAGNOSIS — F4312 Post-traumatic stress disorder, chronic: Secondary | ICD-10-CM | POA: Diagnosis not present

## 2023-04-09 DIAGNOSIS — F99 Mental disorder, not otherwise specified: Secondary | ICD-10-CM | POA: Diagnosis not present

## 2023-04-16 DIAGNOSIS — F4312 Post-traumatic stress disorder, chronic: Secondary | ICD-10-CM | POA: Diagnosis not present

## 2023-04-16 DIAGNOSIS — F3162 Bipolar disorder, current episode mixed, moderate: Secondary | ICD-10-CM | POA: Diagnosis not present

## 2023-04-23 DIAGNOSIS — F4312 Post-traumatic stress disorder, chronic: Secondary | ICD-10-CM | POA: Diagnosis not present

## 2023-04-23 DIAGNOSIS — F3162 Bipolar disorder, current episode mixed, moderate: Secondary | ICD-10-CM | POA: Diagnosis not present

## 2023-04-30 DIAGNOSIS — F3161 Bipolar disorder, current episode mixed, mild: Secondary | ICD-10-CM | POA: Diagnosis not present

## 2023-05-07 DIAGNOSIS — F3161 Bipolar disorder, current episode mixed, mild: Secondary | ICD-10-CM | POA: Diagnosis not present

## 2023-05-14 DIAGNOSIS — F4312 Post-traumatic stress disorder, chronic: Secondary | ICD-10-CM | POA: Diagnosis not present

## 2023-05-14 DIAGNOSIS — F3161 Bipolar disorder, current episode mixed, mild: Secondary | ICD-10-CM | POA: Diagnosis not present

## 2023-05-21 DIAGNOSIS — F4312 Post-traumatic stress disorder, chronic: Secondary | ICD-10-CM | POA: Diagnosis not present

## 2023-05-21 DIAGNOSIS — F3161 Bipolar disorder, current episode mixed, mild: Secondary | ICD-10-CM | POA: Diagnosis not present

## 2023-06-04 DIAGNOSIS — F4312 Post-traumatic stress disorder, chronic: Secondary | ICD-10-CM | POA: Diagnosis not present

## 2023-06-04 DIAGNOSIS — F3132 Bipolar disorder, current episode depressed, moderate: Secondary | ICD-10-CM | POA: Diagnosis not present

## 2023-06-11 DIAGNOSIS — F4312 Post-traumatic stress disorder, chronic: Secondary | ICD-10-CM | POA: Diagnosis not present

## 2023-06-11 DIAGNOSIS — F3161 Bipolar disorder, current episode mixed, mild: Secondary | ICD-10-CM | POA: Diagnosis not present

## 2023-06-18 DIAGNOSIS — F3162 Bipolar disorder, current episode mixed, moderate: Secondary | ICD-10-CM | POA: Diagnosis not present

## 2023-06-25 DIAGNOSIS — F4312 Post-traumatic stress disorder, chronic: Secondary | ICD-10-CM | POA: Diagnosis not present

## 2023-06-25 DIAGNOSIS — F3161 Bipolar disorder, current episode mixed, mild: Secondary | ICD-10-CM | POA: Diagnosis not present

## 2023-06-30 ENCOUNTER — Ambulatory Visit: Payer: Medicaid Other

## 2023-07-02 ENCOUNTER — Ambulatory Visit: Payer: Medicaid Other

## 2023-07-03 DIAGNOSIS — F4312 Post-traumatic stress disorder, chronic: Secondary | ICD-10-CM | POA: Diagnosis not present

## 2023-07-09 DIAGNOSIS — F4312 Post-traumatic stress disorder, chronic: Secondary | ICD-10-CM | POA: Diagnosis not present

## 2023-07-09 DIAGNOSIS — F3131 Bipolar disorder, current episode depressed, mild: Secondary | ICD-10-CM | POA: Diagnosis not present

## 2023-07-24 DIAGNOSIS — F3162 Bipolar disorder, current episode mixed, moderate: Secondary | ICD-10-CM | POA: Diagnosis not present

## 2023-07-30 DIAGNOSIS — F4310 Post-traumatic stress disorder, unspecified: Secondary | ICD-10-CM | POA: Diagnosis not present

## 2023-07-30 DIAGNOSIS — F3161 Bipolar disorder, current episode mixed, mild: Secondary | ICD-10-CM | POA: Diagnosis not present

## 2023-08-06 DIAGNOSIS — F3161 Bipolar disorder, current episode mixed, mild: Secondary | ICD-10-CM | POA: Diagnosis not present

## 2023-08-06 DIAGNOSIS — F4312 Post-traumatic stress disorder, chronic: Secondary | ICD-10-CM | POA: Diagnosis not present

## 2023-08-10 ENCOUNTER — Ambulatory Visit
Admission: EM | Admit: 2023-08-10 | Discharge: 2023-08-10 | Disposition: A | Discharge status: Home / Self Care | Payer: Medicaid Other | Attending: Physician Assistant | Admitting: Physician Assistant

## 2023-08-10 DIAGNOSIS — M797 Fibromyalgia: Secondary | ICD-10-CM | POA: Insufficient documentation

## 2023-08-10 DIAGNOSIS — J069 Acute upper respiratory infection, unspecified: Secondary | ICD-10-CM | POA: Diagnosis not present

## 2023-08-10 DIAGNOSIS — N898 Other specified noninflammatory disorders of vagina: Secondary | ICD-10-CM | POA: Diagnosis not present

## 2023-08-10 DIAGNOSIS — Z1152 Encounter for screening for COVID-19: Secondary | ICD-10-CM | POA: Insufficient documentation

## 2023-08-10 LAB — POCT URINALYSIS DIP (MANUAL ENTRY)
Glucose, UA: NEGATIVE mg/dL
Leukocytes, UA: NEGATIVE
Nitrite, UA: NEGATIVE
Protein Ur, POC: 30 mg/dL — AB
Spec Grav, UA: >=1.03 — AB (ref 1.010–1.025)
Urobilinogen, UA: 2 E.U./dL — AB
pH, UA: 6 (ref 5.0–8.0)

## 2023-08-10 LAB — POCT RAPID STREP A (OFFICE): Rapid Strep A Screen: NEGATIVE

## 2023-08-10 MED ORDER — KETOROLAC TROMETHAMINE 30 MG/ML IJ SOLN
30.0000 mg | Freq: Once | INTRAMUSCULAR | Status: AC
  Administered 2023-08-10: 30 mg via INTRAMUSCULAR

## 2023-08-10 NOTE — ED Provider Notes (Signed)
[facility]    CSN: [id number] Arrival date & time: 08/10/23  1004      History   Chief Complaint Chief Complaint  Patient presents with   Sore Throat   Generalized Body Aches   Vaginal Discharge   Urinary Problems    HPI Caitlin Hartman is a 32 y.o. female.   Patient here today with multiple complaints  She reports that she has had a sore throat over the last few days after having same about 10 days ago.  She states that she has some bodyaches as well.  She denies any fever.  She has not had cough but does have some congestion.  She also reports possible UTI she has had urinary urgency.  She does not report dysuria.  She does report vaginal discharge.  She also notes diffuse bodyaches secondary to fibromyalgia as this is similar to prior flares.  She request injection for treatment as this has been helpful in the past.  The history is provided by the patient.  Sore Throat Pertinent negatives include no abdominal pain and no shortness of breath.  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     Chronic cholecystitis with calculus    Dyspnea    Fibromyalgia    Headache    Hypertension    Iron deficiency anemia    IUD (intrauterine device) in place 03/01/2018   Lyme disease 2013   Mental disorder    Rheumatoid aortitis 2014   pt states dx'd by ED doctor, has not followed up    Vaginal Pap smear, abnormal    Vitamin D deficiency     Patient Active Problem List   Diagnosis Date Noted   Microcytosis 02/11/2023   Cholelithiasis 02/11/2023   Healthcare maintenance 02/11/2023   Multiple joint pain 10/17/2020   Carpal tunnel syndrome 05/09/2020   Bacterial vaginosis  01/11/2020   Need for history and physical examination for employment 09/02/2019   PTSD (post-traumatic stress disorder) 11/08/2017   Iron deficiency anemia 09/11/2017   ANA positive 04/16/2017   Low vitamin D level 04/16/2017   Fibromyalgia 04/07/2017   Asthma, moderate persistent 01/31/2017   Thalassemia trait 08/03/2015   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 Authorizing Provider  albuterol (VENTOLIN HFA) 108 (90 Base) MCG/ACT inhaler Inhale 2 puffs into the lungs every 4 (four) hours as needed for wheezing or shortness of breath. 09/05/21  Yes [provider]  cyanocobalamin (VITAMIN  B12) 1000 MCG/ML injection Inject 1,000 mcg into the muscle every 30 (thirty) days. 08/03/15  Yes [provider]  diclofenac (VOLTAREN) 75 MG EC tablet Take 75 mg by mouth 2 (two) times daily as needed. 03/26/23  Yes [provider]  Fluticasone-Umeclidin-Vilant 100-62.5-25 MCG/ACT AEPB Inhale 1 puff into the lungs daily at 6 (six) AM. 08/08/21  Yes [provider]  pyridOXINE (VITAMIN B6) 25 MG tablet Take 1 tablet by mouth 3 (three) times daily as needed. 03/18/17  Yes [provider]  albuterol (VENTOLIN HFA) 108 (90 Base) MCG/ACT inhaler Inhale 1-2 puffs into the lungs every 4 (four) hours as needed for wheezing or shortness of breath. 09/01/19 02/26/23  Marthenia Rolling, DO  ibuprofen (ADVIL) 800 MG tablet Take 1 tablet (800 mg total) by mouth 3 (three) times daily. 03/17/23   Carlisle Beers, FNP    Family History Family History  Problem Relation  Age of Onset   Alcohol abuse Mother    Drug abuse Mother    Depression Mother    Mental illness Mother    Drug abuse Father    Cancer Maternal Aunt    Breast cancer Maternal Aunt    Diabetes Maternal Uncle    Alcohol abuse Maternal Grandmother    Cancer Maternal Grandmother    Diabetes Maternal Grandmother    Varicose Veins Maternal Grandmother    Drug abuse Maternal Grandfather    Cancer Paternal Grandmother    Breast cancer Paternal Grandmother     Social History Social History   Tobacco Use   Smoking status: Never    Passive exposure: Never   Smokeless tobacco: Never  Vaping Use   Vaping status: Never Used  Substance Use Topics   Alcohol use: No   Drug use: Yes    Frequency: 3.0 times per week    Types: Marijuana    Comment: pt reports using marijuana x3/ wk for her fibromyalgia     Allergies   Oxycodone-acetaminophen, Diphenhydramine-acetaminophen, and Latex   Review of Systems Review of Systems  Constitutional:  Negative for chills and fever.  HENT:  Positive for congestion and sore throat. Negative for ear pain.   Eyes:  Negative for discharge and redness.  Respiratory:  Negative for cough, shortness of breath and wheezing.   Gastrointestinal:  Negative for abdominal pain, diarrhea, nausea and vomiting.  Genitourinary:  Positive for urgency and vaginal discharge. Negative for dysuria.  Musculoskeletal:  Positive for myalgias.     Physical Exam Triage Vital Signs ED Triage Vitals  Encounter Vitals Group     BP 08/10/23 1130 119/80     Systolic BP Percentile --      Diastolic BP Percentile --      Pulse Rate 08/10/23 1130 68     Resp 08/10/23 1130 18     Temp 08/10/23 1130 98.1 F (36.7 C)     Temp Source 08/10/23 1130 Oral     SpO2 08/10/23 1130 98 %     Weight 08/10/23 1126 194 lb (88 kg)     Height 08/10/23 1126 5' 8.5" (1.74 m)     Head Circumference --      Peak Flow --      Pain Score 08/10/23 1122 10     Pain Loc --      Pain Education  --      Exclude from Growth Chart --    No data found.  Updated Vital Signs BP 119/80 (BP Location: Left Arm)  Pulse 68   Temp 98.1 F (36.7 C) (Oral)   Resp 18   Ht 5' 8.5" (1.74 m)   Wt 194 lb (88 kg)   LMP 07/22/2023 (Exact Date)   SpO2 98%   BMI 29.07 kg/m      Physical Exam Vitals and nursing note reviewed.  Constitutional:      General: She is not in acute distress.    Appearance: Normal appearance. She is not ill-appearing.  HENT:     Head: Normocephalic and atraumatic.     Nose: Congestion present.     Mouth/Throat:     Mouth: Mucous membranes are moist.     Pharynx: No oropharyngeal exudate or posterior oropharyngeal erythema.  Eyes:     Conjunctiva/sclera: Conjunctivae normal.  Cardiovascular:     Rate and Rhythm: Normal rate and regular rhythm.     Heart sounds: Normal heart sounds. No murmur heard. Pulmonary:     Effort: Pulmonary effort is normal. No respiratory distress.     Breath sounds: Normal breath sounds. No wheezing, rhonchi or rales.  Skin:    General: Skin is warm and dry.  Neurological:     Mental Status: She is alert.  Psychiatric:        Mood and Affect: Mood normal.        Thought Content: Thought content normal.      UC Treatments / Results  Labs (all labs ordered are listed, but only abnormal results are displayed) Labs Reviewed  POCT URINALYSIS DIP (MANUAL ENTRY) - Abnormal; Notable for the following components:      Result Value   Bilirubin, UA small (*)    Ketones, POC UA small (15) (*)    Spec Grav, UA >=1.030 (*)    Blood, UA trace-intact (*)    Protein Ur, POC =30 (*)    Urobilinogen, UA 2.0 (*)    All other components within normal limits  SARS CORONAVIRUS 2 (TAT 6-24 HRS)  POCT RAPID STREP A (OFFICE)  CERVICOVAGINAL ANCILLARY ONLY    EKG   Radiology No results found.  Procedures Procedures (including critical care time)  Medications Ordered in UC Medications  ketorolac (TORADOL) 30 MG/ML injection 30  mg (30 mg Intramuscular Given 08/10/23 1238)    Initial Impression / Assessment and Plan / UC Course  I have reviewed the triage vital signs and the nursing notes.  Pertinent labs & imaging results that were available during my care of the patient were reviewed by me and considered in my medical decision making (see chart for details).    Toradol injection administered in office for fibromyalgia flare.  Will order screening for gonorrhea, chlamydia, trichomonas, BV and yeast.  COVID screening also ordered.  Strep test negative in office.  UA not consistent with UTI and discussed same with patient.  Recommend she continue to monitor symptoms and follow-up with any persistent symptoms or or worsening symptoms.  Final Clinical Impressions(s) / UC Diagnoses   Final diagnoses:  Acute upper respiratory infection  Vaginal discharge  Fibromyalgia   Discharge Instructions   None    ED Prescriptions   None    PDMP not reviewed this encounter.   Tomi Bamberger, PA-C 08/10/23 1309

## 2023-08-10 NOTE — ED Triage Notes (Signed)
Started with "sore throat about 10 days ago, then started feeling better, then went away on vacation came back this morning and now symptoms are back again with body aches and sore throat". "I think I have a UTI and having Vaginal discharge with a lot of urinary urgency". No fever.

## 2023-08-12 LAB — SARS CORONAVIRUS 2 (TAT 6-24 HRS): SARS Coronavirus 2: NEGATIVE

## 2023-08-13 ENCOUNTER — Telehealth: Payer: Self-pay

## 2023-08-13 DIAGNOSIS — F3132 Bipolar disorder, current episode depressed, moderate: Secondary | ICD-10-CM | POA: Diagnosis not present

## 2023-08-13 DIAGNOSIS — F4312 Post-traumatic stress disorder, chronic: Secondary | ICD-10-CM | POA: Diagnosis not present

## 2023-08-13 MED ORDER — DOXYCYCLINE HYCLATE 100 MG PO CAPS: 100.0000 mg | ORAL_CAPSULE | Freq: Two times a day (BID) | ORAL | 0 refills | Status: DC

## 2023-08-13 MED ORDER — FLUCONAZOLE 150 MG PO TABS: ORAL_TABLET | ORAL | 0 refills | Status: DC

## 2023-08-13 MED ORDER — METRONIDAZOLE 500 MG PO TABS: 500.0000 mg | ORAL_TABLET | Freq: Two times a day (BID) | ORAL | 0 refills | Status: DC

## 2023-08-13 NOTE — Telephone Encounter (Signed)
Pt called wanting medication for treatment for positive swab from 08/10/23 visit.

## 2023-08-13 NOTE — Telephone Encounter (Signed)
Meds prescribed for treatment of chlamydia, BV, yeast and Trich. She should abstain from sexual intercourse until one week post completion of treatment to prevent spread.

## 2023-08-14 ENCOUNTER — Encounter: Payer: Self-pay | Admitting: Obstetrics and Gynecology

## 2023-08-14 ENCOUNTER — Ambulatory Visit (INDEPENDENT_AMBULATORY_CARE_PROVIDER_SITE_OTHER): Payer: Medicaid Other | Admitting: Obstetrics and Gynecology

## 2023-08-14 ENCOUNTER — Other Ambulatory Visit (HOSPITAL_COMMUNITY)
Admission: RE | Admit: 2023-08-14 | Discharge: 2023-08-14 | Disposition: A | Discharge status: Home / Self Care | Payer: Medicaid Other | Source: Ambulatory Visit | Attending: Obstetrics & Gynecology | Admitting: Obstetrics & Gynecology

## 2023-08-14 DIAGNOSIS — A599 Trichomoniasis, unspecified: Secondary | ICD-10-CM

## 2023-08-14 DIAGNOSIS — Z1339 Encounter for screening examination for other mental health and behavioral disorders: Secondary | ICD-10-CM

## 2023-08-14 DIAGNOSIS — A749 Chlamydial infection, unspecified: Secondary | ICD-10-CM | POA: Diagnosis not present

## 2023-08-14 DIAGNOSIS — Z01419 Encounter for gynecological examination (general) (routine) without abnormal findings: Secondary | ICD-10-CM | POA: Diagnosis not present

## 2023-08-14 DIAGNOSIS — R110 Nausea: Secondary | ICD-10-CM | POA: Diagnosis not present

## 2023-08-14 LAB — CERVICOVAGINAL ANCILLARY ONLY
Bacterial Vaginitis (gardnerella): POSITIVE — AB
Candida Glabrata: NEGATIVE
Candida Vaginitis: POSITIVE — AB
Chlamydia: POSITIVE — AB
Comment: NEGATIVE
Comment: NEGATIVE
Comment: NEGATIVE
Comment: NEGATIVE
Comment: NEGATIVE
Comment: NORMAL
Neisseria Gonorrhea: NEGATIVE
Trichomonas: POSITIVE — AB

## 2023-08-14 MED ORDER — ONDANSETRON HCL 4 MG PO TABS: 4.0000 mg | ORAL_TABLET | Freq: Three times a day (TID) | ORAL | 0 refills | Status: DC | PRN

## 2023-08-14 NOTE — Progress Notes (Signed)
GYNECOLOGY ANNUAL PREVENTATIVE CARE ENCOUNTER NOTE  History:     Caitlin Hartman is a 32 y.o. [phone] female here for a routine annual gynecologic exam.  Current complaints: recent chlamydia and trichomonas infection.   Denies abnormal vaginal bleeding, discharge, pelvic pain, problems with intercourse or other gynecologic concerns.    Gynecologic History Patient's last menstrual period was 07/22/2023 (exact date). Contraception:  desires IUD Last Pap: 03/2017. Results were: normal with negative HPV Last mammogram: n/a  Obstetric History OB History  Gravida Para Term Preterm AB Living  8 4 4  0 4 4  SAB IAB Ectopic Multiple Live Births    1   0 4    # Outcome Date GA Lbr Len/2nd Weight Sex Type Anes PTL Lv  8 Term 10/25/17 [redacted]w[redacted]d 02:25 / 00:31 6 lb 11.1 oz (3.036 kg) F Vag-Spont None  LIV     Birth Comments: WNL   7 AB 2012          6 AB 2012          5 Term 12/08/10     Vag-Spont   LIV  4 AB 2010          3 Term 08/09/08     Vag-Spont   LIV  2 Term 09/21/07     Vag-Spont   LIV  1 IAB 2003 [redacted]w[redacted]d           Past Medical History:  Diagnosis Date   Anemia    Asthma, moderate persistent    Bipolar disorder (HCC) 11/08/2017   Breast mass 11/08/2017   Right, 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     Chronic cholecystitis with calculus    Dyspnea    Fibromyalgia    Headache    Hypertension    Iron deficiency anemia    IUD (intrauterine device) in place 03/01/2018   Lyme disease 2013   Mental disorder    Rheumatoid aortitis 2014   pt states dx'd by ED doctor, has not followed up    Vaginal Pap smear, abnormal    Vitamin D deficiency     Past Surgical History:  Procedure Laterality Date   INDUCED ABORTION     x 3    TONSILLECTOMY  03/2010   WISDOM TOOTH EXTRACTION      Current Outpatient Medications on File Prior to Visit  Medication Sig Dispense Refill   albuterol (VENTOLIN HFA) 108 (90 Base) MCG/ACT inhaler Inhale 2 puffs into the  lungs every 4 (four) hours as needed for wheezing or shortness of breath.     doxycycline (VIBRAMYCIN) 100 MG capsule Take 1 capsule (100 mg total) by mouth 2 (two) times daily. 20 capsule 0   metroNIDAZOLE (FLAGYL) 500 MG tablet Take 1 tablet (500 mg total) by mouth 2 (two) times daily. 14 tablet 0   albuterol (VENTOLIN HFA) 108 (90 Base) MCG/ACT inhaler Inhale 1-2 puffs into the lungs every 4 (four) hours as needed for wheezing or shortness of breath. 6.7 g 2   No current facility-administered medications on file prior to visit.    Allergies  Allergen Reactions   Oxycodone-Acetaminophen Nausea And Vomiting    Other Reaction(s): Dizziness, GI Intolerance   Diphenhydramine-Acetaminophen Nausea And Vomiting   Latex Hives    Social History:  reports that she has never smoked. She has never been exposed to tobacco smoke. She has never used smokeless tobacco. She reports current drug use. Frequency: 3.00 times per week. Drug:  Marijuana. She reports that she does not drink alcohol.  Family History  Problem Relation Age of Onset   Alcohol abuse Mother    Drug abuse Mother    Depression Mother    Mental illness Mother    Drug abuse Father    Cancer Maternal Aunt    Breast cancer Maternal Aunt    Diabetes Maternal Uncle    Alcohol abuse Maternal Grandmother    Cancer Maternal Grandmother    Diabetes Maternal Grandmother    Varicose Veins Maternal Grandmother    Drug abuse Maternal Grandfather    Cancer Paternal Grandmother    Breast cancer Paternal Grandmother     The following portions of the patient's history were reviewed and updated as appropriate: allergies, current medications, past family history, past medical history, past social history, past surgical history and problem list.  Review of Systems Pertinent items noted in HPI and remainder of comprehensive ROS otherwise negative.  Physical Exam:  BP 134/87   Pulse 62   Ht 5' 8.5" (1.74 m)   Wt 194 lb 4.8 oz (88.1 kg)    LMP 07/22/2023 (Exact Date)   BMI 29.11 kg/m  CONSTITUTIONAL: Well-developed, well-nourished female in no acute distress.  HENT:  Normocephalic, atraumatic, External right and left ear normal. Oropharynx is clear and moist EYES: Conjunctivae and EOM are normal.  NECK: Normal range of motion, supple, no masses.  Normal thyroid.  SKIN: Skin is warm and dry. No rash noted. Not diaphoretic. No erythema. No pallor. MUSCULOSKELETAL: Normal range of motion. No tenderness.  No cyanosis, clubbing, or edema.  2+ distal pulses. NEUROLOGIC: Alert and oriented to person, place, and time. Normal reflexes, muscle tone coordination.  PSYCHIATRIC: Normal mood and affect. Normal behavior. Normal judgment and thought content. CARDIOVASCULAR: Normal heart rate noted, regular rhythm RESPIRATORY: Clear to auscultation bilaterally. Effort and breath sounds normal, no problems with respiration noted. BREASTS: deferred ABDOMEN: Soft, no distention noted.  No tenderness, rebound or guarding.  PELVIC: Normal appearing external genitalia and urethral meatus; normal appearing vaginal mucosa and cervix.  No abnormal discharge noted.  Pap smear obtained.  Normal uterine size, no other palpable masses, no uterine or adnexal tenderness.  Performed in the presence of a chaperone.  No adnexal pain or CMT noted.   Assessment and Plan:    1. Women's annual routine gynecological examination Normal annual exam, pt desires mirena IUD placement after she has negative TOC from vaginal infection. - Cytology - PAP( [facility]) - POCT urine pregnancy  2. Chlamydia infection Pt already treated for infection.  Advised no unprotected intercourse with partner until both have negative test of cure in 6-8 weeks.  Pt declined full STD panel with blood work.  3. Trichomonas infection See above  4. Mild nausea Pt requested nausea medication due to mild nausea from current abx. - ondansetron (ZOFRAN) 4 MG tablet; Take 1 tablet (4 mg  total) by mouth every 8 (eight) hours as needed for nausea or vomiting.  Dispense: 12 tablet; Refill: 0  Will follow up results of pap smear and manage accordingly. Mammogram scheduled Routine preventative health maintenance measures emphasized. Please refer to After Visit Summary for other counseling recommendations.      Mariel Aloe, MD, FACOG Obstetrician & Gynecologist, [facility] for [facility], [facility]

## 2023-08-14 NOTE — Progress Notes (Signed)
New patient is in the office for GYN visit.  Last pap 04/07/2017 Pt is currently being treated for +CT and Trich from testing performed at urgent care on 08/10/2023 Pt would like to discuss BC options today, unsure of method

## 2023-08-15 LAB — CYTOLOGY - PAP
Comment: NEGATIVE
Diagnosis: NEGATIVE
High risk HPV: NEGATIVE

## 2023-08-27 DIAGNOSIS — F4312 Post-traumatic stress disorder, chronic: Secondary | ICD-10-CM | POA: Diagnosis not present

## 2023-08-27 DIAGNOSIS — F310 Bipolar disorder, current episode hypomanic: Secondary | ICD-10-CM | POA: Diagnosis not present

## 2023-09-03 DIAGNOSIS — F3161 Bipolar disorder, current episode mixed, mild: Secondary | ICD-10-CM | POA: Diagnosis not present

## 2023-09-03 DIAGNOSIS — F4312 Post-traumatic stress disorder, chronic: Secondary | ICD-10-CM | POA: Diagnosis not present

## 2023-09-10 DIAGNOSIS — F4312 Post-traumatic stress disorder, chronic: Secondary | ICD-10-CM | POA: Diagnosis not present

## 2023-09-18 DIAGNOSIS — F3160 Bipolar disorder, current episode mixed, unspecified: Secondary | ICD-10-CM | POA: Diagnosis not present

## 2023-09-25 ENCOUNTER — Other Ambulatory Visit (HOSPITAL_COMMUNITY)
Admission: RE | Admit: 2023-09-25 | Discharge: 2023-09-25 | Disposition: A | Discharge status: Home / Self Care | Payer: Medicaid Other | Source: Ambulatory Visit | Attending: Obstetrics and Gynecology | Admitting: Obstetrics and Gynecology

## 2023-09-25 ENCOUNTER — Ambulatory Visit: Payer: Medicaid Other

## 2023-09-25 VITALS — BP 126/81 | HR 57 | Wt 190.8 lb

## 2023-09-25 DIAGNOSIS — Z113 Encounter for screening for infections with a predominantly sexual mode of transmission: Secondary | ICD-10-CM

## 2023-09-25 DIAGNOSIS — Z202 Contact with and (suspected) exposure to infections with a predominantly sexual mode of transmission: Secondary | ICD-10-CM

## 2023-09-25 DIAGNOSIS — F4312 Post-traumatic stress disorder, chronic: Secondary | ICD-10-CM | POA: Diagnosis not present

## 2023-09-25 NOTE — Progress Notes (Signed)
/..  SUBJECTIVE: / 32 y.o. female is in the office for [facility] for positive CT and Trich on 08/10/2023. Pt states that she completed all medications prescribed. Denies abnormal vaginal discharge, bleeding or significant pelvic pain or fever. No UTI symptoms.   No LMP recorded.  OBJECTIVE:  She appears well, afebrile. Urine dipstick: not done.  ASSESSMENT:  TOC for +CT and Trichomonas    PLAN:  GC, chlamydia, trichomonas, BVAG, CVAG probe, and STD blood work sent to lab.Treatment: To be determined once lab results are received ROV prn if symptoms persist or worsen.

## 2023-09-26 LAB — CERVICOVAGINAL ANCILLARY ONLY
Bacterial Vaginitis (gardnerella): NEGATIVE
Candida Glabrata: NEGATIVE
Candida Vaginitis: NEGATIVE
Chlamydia: NEGATIVE
Comment: NEGATIVE
Comment: NEGATIVE
Comment: NEGATIVE
Comment: NEGATIVE
Comment: NEGATIVE
Comment: NORMAL
Neisseria Gonorrhea: NEGATIVE
Trichomonas: NEGATIVE

## 2023-09-26 LAB — HIV ANTIBODY (ROUTINE TESTING W REFLEX): HIV Screen 4th Generation wRfx: NONREACTIVE

## 2023-09-26 LAB — HEPATITIS C ANTIBODY: Hep C Virus Ab: NONREACTIVE

## 2023-09-26 LAB — RPR: RPR Ser Ql: NONREACTIVE

## 2023-09-26 LAB — HEPATITIS B SURFACE ANTIGEN: Hepatitis B Surface Ag: NEGATIVE

## 2023-10-02 DIAGNOSIS — F3161 Bipolar disorder, current episode mixed, mild: Secondary | ICD-10-CM | POA: Diagnosis not present

## 2023-10-02 DIAGNOSIS — F4312 Post-traumatic stress disorder, chronic: Secondary | ICD-10-CM | POA: Diagnosis not present

## 2023-10-06 ENCOUNTER — Ambulatory Visit
Admission: EM | Admit: 2023-10-06 | Discharge: 2023-10-06 | Disposition: A | Discharge status: Home / Self Care | Payer: Medicaid Other | Attending: Internal Medicine | Admitting: Internal Medicine

## 2023-10-06 ENCOUNTER — Encounter: Payer: Self-pay | Admitting: Emergency Medicine

## 2023-10-06 ENCOUNTER — Other Ambulatory Visit: Payer: Self-pay

## 2023-10-06 ENCOUNTER — Ambulatory Visit: Payer: Medicaid Other

## 2023-10-06 DIAGNOSIS — M79641 Pain in right hand: Secondary | ICD-10-CM | POA: Diagnosis not present

## 2023-10-06 DIAGNOSIS — W19XXXA Unspecified fall, initial encounter: Secondary | ICD-10-CM | POA: Diagnosis not present

## 2023-10-06 NOTE — ED Triage Notes (Signed)
Patient reports a fall.  Patient reports slipping and falling.  patient was inside her home.  Incident occurred 2 days ago.  Reports hitting back of head on floor.  No loc.  Patient thinks hand hit the wall.  Right hand is swollen and painful.  Pain particularly at base of ring finger.    Has not taken any medicines for pain

## 2023-10-06 NOTE — Discharge Instructions (Signed)
I will call if any x-ray results are abnormal.  Follow-up with hand specialist if pain persists or worsens.  Elevate and apply ice.

## 2023-10-06 NOTE — ED Provider Notes (Signed)
[facility]    CSN: [id number] Arrival date & time: 10/06/23  0941      History   Chief Complaint Chief Complaint  Patient presents with   Hand Pain    HPI Caitlin Hartman is a 32 y.o. female.   Patient presents with right hand pain after a fall that occurred about 2 days ago.  Patient reports that she was running to greet her husband at home when she slipped on the floor falling backwards.  She states that she did hit the back of her head but denies loss of consciousness.  Denies any headache, dizziness, blurred vision, nausea, vomiting.  She does not take any blood thinning medications.  Reports that she is having right hand pain but is not exactly sure how she injured it during the fall.  Pain is present on the lateral portion of the hand.  She has not taken anything for pain.  Denies numbness or tingling. She has not noted any swelling, abrasions, lacerations to head.    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     Chronic cholecystitis with calculus    Dyspnea    Fibromyalgia    Headache    Hypertension    Iron deficiency anemia    IUD (intrauterine device) in place 03/01/2018   Lyme disease 2013   Mental disorder    Rheumatoid aortitis 2014   pt states dx'd by ED doctor, has not followed up    Vaginal Pap smear, abnormal    Vitamin D deficiency     Patient Active Problem List   Diagnosis Date Noted   Microcytosis 02/11/2023   Cholelithiasis 02/11/2023   Healthcare maintenance 02/11/2023   Multiple joint pain 10/17/2020   Carpal tunnel syndrome 05/09/2020   Bacterial vaginosis 01/11/2020   Need for history and physical examination for employment 09/02/2019   PTSD (post-traumatic stress disorder) 11/08/2017   Iron deficiency anemia 09/11/2017   ANA  positive 04/16/2017   Low vitamin D level 04/16/2017   Fibromyalgia 04/07/2017   Asthma, moderate persistent 01/31/2017   Thalassemia trait 08/03/2015   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 Authorizing Provider  albuterol (VENTOLIN HFA) 108 (90 Base) MCG/ACT inhaler Inhale 1-2 puffs into the lungs every 4 (four) hours as needed for wheezing or shortness of breath. 09/01/19 02/26/23  Marthenia Rolling, DO  albuterol (VENTOLIN HFA) 108 (90 Base) MCG/ACT inhaler Inhale 2 puffs into the lungs every 4 (four) hours as needed for wheezing or shortness of breath. 09/05/21   [provider]  doxycycline (VIBRAMYCIN) 100 MG capsule Take 1 capsule (100 mg total) by mouth 2 (two) times daily. Patient not taking: Reported on 10/06/2023 08/13/23   Tomi Bamberger, PA-C  metroNIDAZOLE (FLAGYL) 500 MG tablet Take 1 tablet (500 mg total) by mouth 2 (two) times daily. Patient not taking: Reported on 10/06/2023 08/13/23   Tomi Bamberger, PA-C  ondansetron (ZOFRAN) 4 MG tablet Take 1 tablet (4 mg total) by mouth every 8 (eight) hours as needed for nausea or vomiting. Patient not taking: Reported on 10/06/2023 08/14/23   Warden Fillers, MD    Family History Family History  Problem Relation Age of Onset   Alcohol abuse Mother    Drug abuse Mother    Depression Mother    Mental illness Mother    Drug abuse Father    Cancer Maternal Aunt    Breast cancer Maternal Aunt    Diabetes Maternal Uncle    Alcohol abuse Maternal Grandmother    Cancer Maternal Grandmother    Diabetes Maternal  Grandmother    Varicose Veins Maternal Grandmother    Drug abuse Maternal Grandfather    Cancer Paternal Grandmother    Breast cancer Paternal Grandmother     Social History Social History   Tobacco Use   Smoking status: Never    Passive exposure: Never   Smokeless tobacco: Never  Vaping Use   Vaping status: Never Used  Substance Use Topics   Alcohol use: Yes   Drug use: Yes    Frequency: 3.0 times per week    Types: Marijuana    Comment: pt reports using marijuana x3/ wk for her fibromyalgia     Allergies   Oxycodone-acetaminophen, Diphenhydramine-acetaminophen, and Latex   Review of Systems Review of Systems Per HPI  Physical Exam Triage Vital Signs ED Triage Vitals  Encounter Vitals Group     BP 10/06/23 1014 117/81     Systolic BP Percentile --      Diastolic BP Percentile --      Pulse Rate 10/06/23 1014 62     Resp 10/06/23 1014 20     Temp 10/06/23 1014 98.3 F (36.8 C)     Temp Source 10/06/23 1014 Oral     SpO2 10/06/23 1014 97 %     Weight --      Height --      Head Circumference --      Peak Flow --      Pain Score 10/06/23 1011 9     Pain Loc --      Pain Education --      Exclude from Growth Chart --    No data found.  Updated Vital Signs BP 117/81 (BP Location: Left Arm) Comment (BP Location): large cuff  Pulse 62   Temp 98.3 F (36.8 C) (Oral)   Resp 20   LMP 09/16/2023 (Approximate)   SpO2 97%   Visual Acuity Right Eye Distance:   Left Eye Distance:   Bilateral Distance:    Right Eye Near:   Left Eye Near:    Bilateral Near:     Physical Exam Constitutional:      General: She is not in acute distress.    Appearance: Normal appearance. She is not toxic-appearing or diaphoretic.  HENT:     Head: Normocephalic and atraumatic.  Eyes:     Extraocular Movements: Extraocular movements intact.     Conjunctiva/sclera: Conjunctivae normal.  Pulmonary:     Effort: Pulmonary effort is normal.  Musculoskeletal:     Comments:  Patient has tenderness to palpation overlying the dorsal surface of the hand at the fourth MCP joint.  Full range of motion of fingers present.  Capillary refill and pulses intact.  No abrasions or lacerations noted.  No swelling or discoloration noted.  Neurological:     General: No focal deficit present.     Mental Status: She is alert and oriented to person, place, and time. Mental status is at baseline.     Cranial Nerves: Cranial nerves 2-12 are intact.     Sensory: Sensation is intact.     Motor: Motor function is intact.     Coordination: Coordination is intact.     Gait: Gait is intact.  Psychiatric:        Mood and Affect: Mood normal.        Behavior: Behavior normal.        Thought Content: Thought content normal.        Judgment: Judgment normal.      UC Treatments / Results  Labs (all labs ordered are listed, but only abnormal results are displayed) Labs Reviewed - No data to display  EKG   Radiology DG Hand Complete Right  Result Date: 10/06/2023 CLINICAL DATA:  Fall 2 days ago. Pain particularly at base of fourth finger. Swelling and pain. EXAM: RIGHT HAND - COMPLETE 3+ VIEW COMPARISON:  Right hand radiographs 08/07/2015 FINDINGS: Normal bone mineralization. Redemonstration of mild dorsal apex angulation of the fifth metacarpal neck, an old healed fracture. Joint spaces are preserved. No acute fracture is seen. No dislocation. IMPRESSION: 1. No acute fracture. 2. Old healed fracture of the fifth metacarpal neck. Electronically Signed   By: Neita Garnet M.D.   On: 10/06/2023 11:28    Procedures Procedures (including critical care time)  Medications Ordered in UC Medications - No data to display  Initial Impression / Assessment and Plan / UC Course  I have reviewed the triage vital signs and the nursing notes.  Pertinent labs & imaging results that were available during my care of the patient were reviewed by me and considered in my medical decision making (see  chart for details).     Hand x-ray is negative for any acute bony abnormality.  Given unknown mechanism of injury, suspect muscular strain versus contusion.  Advised safe over-the-counter pain relievers, ice application, elevation of extremity.  Advised following up with orthopedist at provided contact information if symptoms persist or worsen.  Discussed with patient that we do not have any capabilities of imaging of the head here given possible head injury.  Although, given patient is not having subsequent neurological symptoms or headache, do not think that this is necessary.  I did discuss with patient going strictly to the emergency department if she develops headache, dizziness, blurred vision, nausea, vomiting, or any additional neurological symptoms.  Patient verbalized understanding and was agreeable with plan. Final Clinical Impressions(s) / UC Diagnoses   Final diagnoses:  Right hand pain  Fall, initial encounter     Discharge Instructions      I will call if any x-ray results are abnormal.  Follow-up with hand specialist if pain persists or worsens.  Elevate and apply ice.     ED Prescriptions   None    PDMP not reviewed this encounter.   Gustavus Bryant, [place] 10/06/23 [phone]

## 2023-10-15 DIAGNOSIS — M79641 Pain in right hand: Secondary | ICD-10-CM | POA: Diagnosis not present

## 2023-10-16 DIAGNOSIS — F3160 Bipolar disorder, current episode mixed, unspecified: Secondary | ICD-10-CM | POA: Diagnosis not present

## 2023-10-16 DIAGNOSIS — F4312 Post-traumatic stress disorder, chronic: Secondary | ICD-10-CM | POA: Diagnosis not present

## 2023-10-23 DIAGNOSIS — F3162 Bipolar disorder, current episode mixed, moderate: Secondary | ICD-10-CM | POA: Diagnosis not present

## 2023-10-23 DIAGNOSIS — F4312 Post-traumatic stress disorder, chronic: Secondary | ICD-10-CM | POA: Diagnosis not present

## 2023-10-30 DIAGNOSIS — F3161 Bipolar disorder, current episode mixed, mild: Secondary | ICD-10-CM | POA: Diagnosis not present

## 2023-10-30 DIAGNOSIS — F4312 Post-traumatic stress disorder, chronic: Secondary | ICD-10-CM | POA: Diagnosis not present

## 2023-11-13 DIAGNOSIS — F3160 Bipolar disorder, current episode mixed, unspecified: Secondary | ICD-10-CM | POA: Diagnosis not present

## 2023-11-13 DIAGNOSIS — F4312 Post-traumatic stress disorder, chronic: Secondary | ICD-10-CM | POA: Diagnosis not present

## 2023-12-01 DIAGNOSIS — Z91410 Personal history of adult physical and sexual abuse: Secondary | ICD-10-CM | POA: Diagnosis not present

## 2023-12-01 DIAGNOSIS — F4312 Post-traumatic stress disorder, chronic: Secondary | ICD-10-CM | POA: Diagnosis not present

## 2024-01-06 DIAGNOSIS — F4312 Post-traumatic stress disorder, chronic: Secondary | ICD-10-CM | POA: Diagnosis not present

## 2024-01-06 DIAGNOSIS — F3161 Bipolar disorder, current episode mixed, mild: Secondary | ICD-10-CM | POA: Diagnosis not present

## 2024-01-16 DIAGNOSIS — H5213 Myopia, bilateral: Secondary | ICD-10-CM | POA: Diagnosis not present

## 2024-01-22 DIAGNOSIS — F3161 Bipolar disorder, current episode mixed, mild: Secondary | ICD-10-CM | POA: Diagnosis not present

## 2024-02-05 DIAGNOSIS — F4312 Post-traumatic stress disorder, chronic: Secondary | ICD-10-CM | POA: Diagnosis not present

## 2024-02-05 DIAGNOSIS — F3160 Bipolar disorder, current episode mixed, unspecified: Secondary | ICD-10-CM | POA: Diagnosis not present

## 2024-02-17 DIAGNOSIS — F4312 Post-traumatic stress disorder, chronic: Secondary | ICD-10-CM | POA: Diagnosis not present

## 2024-02-17 DIAGNOSIS — F3162 Bipolar disorder, current episode mixed, moderate: Secondary | ICD-10-CM | POA: Diagnosis not present

## 2024-02-26 DIAGNOSIS — Z62820 Parent-biological child conflict: Secondary | ICD-10-CM | POA: Diagnosis not present

## 2024-02-26 DIAGNOSIS — F3160 Bipolar disorder, current episode mixed, unspecified: Secondary | ICD-10-CM | POA: Diagnosis not present

## 2024-03-09 DIAGNOSIS — F3162 Bipolar disorder, current episode mixed, moderate: Secondary | ICD-10-CM | POA: Diagnosis not present

## 2024-03-09 DIAGNOSIS — F4312 Post-traumatic stress disorder, chronic: Secondary | ICD-10-CM | POA: Diagnosis not present

## 2024-03-25 DIAGNOSIS — Z630 Problems in relationship with spouse or partner: Secondary | ICD-10-CM | POA: Diagnosis not present

## 2024-03-25 DIAGNOSIS — F3162 Bipolar disorder, current episode mixed, moderate: Secondary | ICD-10-CM | POA: Diagnosis not present

## 2024-04-08 DIAGNOSIS — F3162 Bipolar disorder, current episode mixed, moderate: Secondary | ICD-10-CM | POA: Diagnosis not present

## 2024-04-29 DIAGNOSIS — F4312 Post-traumatic stress disorder, chronic: Secondary | ICD-10-CM | POA: Diagnosis not present

## 2024-04-29 DIAGNOSIS — Z630 Problems in relationship with spouse or partner: Secondary | ICD-10-CM | POA: Diagnosis not present

## 2024-05-06 DIAGNOSIS — F3131 Bipolar disorder, current episode depressed, mild: Secondary | ICD-10-CM | POA: Diagnosis not present

## 2024-05-06 DIAGNOSIS — Z630 Problems in relationship with spouse or partner: Secondary | ICD-10-CM | POA: Diagnosis not present

## 2024-05-19 DIAGNOSIS — Z630 Problems in relationship with spouse or partner: Secondary | ICD-10-CM | POA: Diagnosis not present

## 2024-05-19 DIAGNOSIS — F3131 Bipolar disorder, current episode depressed, mild: Secondary | ICD-10-CM | POA: Diagnosis not present

## 2024-06-02 DIAGNOSIS — F3131 Bipolar disorder, current episode depressed, mild: Secondary | ICD-10-CM | POA: Diagnosis not present

## 2024-06-02 DIAGNOSIS — Z630 Problems in relationship with spouse or partner: Secondary | ICD-10-CM | POA: Diagnosis not present

## 2024-06-17 DIAGNOSIS — F3131 Bipolar disorder, current episode depressed, mild: Secondary | ICD-10-CM | POA: Diagnosis not present

## 2024-06-17 DIAGNOSIS — Z630 Problems in relationship with spouse or partner: Secondary | ICD-10-CM | POA: Diagnosis not present

## 2024-06-24 DIAGNOSIS — Z6827 Body mass index (BMI) 27.0-27.9, adult: Secondary | ICD-10-CM | POA: Diagnosis not present

## 2024-06-24 DIAGNOSIS — H6123 Impacted cerumen, bilateral: Secondary | ICD-10-CM | POA: Diagnosis not present

## 2024-07-21 ENCOUNTER — Ambulatory Visit
Admission: EM | Admit: 2024-07-21 | Discharge: 2024-07-21 | Disposition: A | Discharge status: Home / Self Care | Attending: Family Medicine | Admitting: Family Medicine

## 2024-07-21 ENCOUNTER — Encounter: Payer: Self-pay | Admitting: Emergency Medicine

## 2024-07-21 DIAGNOSIS — M797 Fibromyalgia: Secondary | ICD-10-CM | POA: Diagnosis not present

## 2024-07-21 MED ORDER — KETOROLAC TROMETHAMINE 30 MG/ML IJ SOLN
60.0000 mg | Freq: Once | INTRAMUSCULAR | Status: AC
  Administered 2024-07-21 (×2): 60 mg via INTRAMUSCULAR

## 2024-07-21 NOTE — ED Provider Notes (Addendum)
 [facility]   [id number] 07/21/24 Arrival Time: 9046  ASSESSMENT & PLAN:  1. Fibromyalgia muscle pain   Exacerbation.  Meds ordered this encounter  Medications   ketorolac  (TORADOL ) 30 MG/ML injection 60 mg  Reports this has helped in the past.   Follow-up Information     Renne Homans, MD.   Specialty: Internal Medicine Why: If worsening or failing to improve as anticipated. Contact information: [address] [place] [address] [phone]                 Reviewed expectations re: course of current medical issues. Questions answered. Outlined signs and symptoms indicating need for more acute intervention. Understanding verbalized. After Visit Summary given.   SUBJECTIVE: History from: Patient. Caitlin Hartman is a 33 y.o. female. Pt presents c/o leg pain due to fibromyalgia x 2 days. Pt says she was playing outside with the kids and now has uncontrollable.   OBJECTIVE:  Vitals:   07/21/24 1018 07/21/24 1024  BP:  133/79  Pulse:  60  Resp:  18  Temp:  98.3 F (36.8 C)  TempSrc:  Oral  SpO2:  98%  Weight: 86.5 kg     General appearance: alert; no distress Extremities: no edema; reports generalized pain in anterior thighs Skin: warm and dry Neurologic: normal gait Psychological: alert and cooperative; normal mood and affect  Allergies  Allergen Reactions   Oxycodone -Acetaminophen  Nausea And Vomiting    Other Reaction(s): Dizziness, GI Intolerance   Diphenhydramine -Acetaminophen  Nausea And Vomiting   Latex Hives    Past Medical History:  Diagnosis Date   Anemia    Asthma, moderate persistent    Bipolar disorder (HCC) 11/08/2017   Breast mass 11/08/2017   Right, 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     Chronic cholecystitis with calculus    Dyspnea    Fibromyalgia    Headache    Hypertension    Iron deficiency anemia    IUD (intrauterine device) in place 03/01/2018   Lyme  disease 2013   Mental disorder    Rheumatoid aortitis 2014   pt states dx'd by ED doctor, has not followed up    Vaginal Pap smear, abnormal    Vitamin D  deficiency    Social History   Socioeconomic History   Marital status: Married    Spouse name: Camellia   Number of children: 4   Years of education: Not on file   Highest education level: Not on file  Occupational History   Occupation: unemployed  Tobacco Use   Smoking status: Never    Passive exposure: Never   Smokeless tobacco: Never  Vaping Use   Vaping status: Never Used  Substance and Sexual Activity   Alcohol use: Yes   Drug use: Yes    Frequency: 3.0 times per week    Types: Marijuana    Comment: pt reports using marijuana x3/ wk for her fibromyalgia   Sexual activity: Yes    Partners: Male    Birth control/protection: None    Comment: Last encounter: 08-06-2023  Other Topics Concern   Not on file  Social History Narrative   Not on file   Social Drivers of Health   Financial Resource Strain: Low Risk  (01/21/2023)   Overall Financial Resource Strain (CARDIA)    Difficulty of Paying Living Expenses: Not very hard  Food Insecurity: No Food Insecurity (01/21/2023)   Hunger Vital Sign    Worried About Running  Out of Food in the Last Year: Never true    Ran Out of Food in the Last Year: Never true  Transportation Needs: No Transportation Needs (01/21/2023)   PRAPARE - Administrator, Civil Service (Medical): No    Lack of Transportation (Non-Medical): No  Physical Activity: Not on file  Stress: Not on file  Social Connections: Not on file  Intimate Partner Violence: Not At Risk (01/21/2023)   Humiliation, Afraid, Rape, and Kick questionnaire    Fear of Current or Ex-Partner: No    Emotionally Abused: No    Physically Abused: No    Sexually Abused: No   Family History  Problem Relation Age of Onset   Alcohol abuse Mother    Drug abuse Mother    Depression Mother    Mental illness Mother    Drug  abuse Father    Cancer Maternal Aunt    Breast cancer Maternal Aunt    Diabetes Maternal Uncle    Alcohol abuse Maternal Grandmother    Cancer Maternal Grandmother    Diabetes Maternal Grandmother    Varicose Veins Maternal Grandmother    Drug abuse Maternal Grandfather    Cancer Paternal Grandmother    Breast cancer Paternal Grandmother    Past Surgical History:  Procedure Laterality Date   INDUCED ABORTION     x 3    TONSILLECTOMY  03/2010   WISDOM TOOTH EXTRACTION       Rolinda Rogue, MD 07/21/24 1057    Rolinda Rogue, MD 07/21/24 1057

## 2024-07-21 NOTE — ED Triage Notes (Signed)
 Pt presents c/o leg pain due to fibromyalgia x 2 days. Pt says she was playing outside with the kids and now has uncontrollable.

## 2024-07-22 DIAGNOSIS — F3162 Bipolar disorder, current episode mixed, moderate: Secondary | ICD-10-CM | POA: Diagnosis not present

## 2024-08-05 DIAGNOSIS — F3161 Bipolar disorder, current episode mixed, mild: Secondary | ICD-10-CM | POA: Diagnosis not present

## 2024-08-05 DIAGNOSIS — Z630 Problems in relationship with spouse or partner: Secondary | ICD-10-CM | POA: Diagnosis not present

## 2024-08-19 DIAGNOSIS — Z630 Problems in relationship with spouse or partner: Secondary | ICD-10-CM | POA: Diagnosis not present

## 2024-08-19 DIAGNOSIS — F3131 Bipolar disorder, current episode depressed, mild: Secondary | ICD-10-CM | POA: Diagnosis not present

## 2024-09-02 DIAGNOSIS — F3131 Bipolar disorder, current episode depressed, mild: Secondary | ICD-10-CM | POA: Diagnosis not present

## 2024-09-02 DIAGNOSIS — Z630 Problems in relationship with spouse or partner: Secondary | ICD-10-CM | POA: Diagnosis not present

## 2024-09-10 ENCOUNTER — Ambulatory Visit
Admission: RE | Admit: 2024-09-10 | Discharge: 2024-09-10 | Disposition: A | Discharge status: Home / Self Care | Source: Ambulatory Visit | Attending: Student | Admitting: Student

## 2024-09-10 VITALS — BP 125/89 | HR 70 | Temp 98.0°F | Resp 14

## 2024-09-10 DIAGNOSIS — N760 Acute vaginitis: Secondary | ICD-10-CM | POA: Diagnosis not present

## 2024-09-10 MED ORDER — FLUCONAZOLE 150 MG PO TABS: 150.0000 mg | ORAL_TABLET | Freq: Every day | ORAL | 0 refills | Status: AC

## 2024-09-10 NOTE — ED Triage Notes (Signed)
 Pt reports thick, white vaginal discharge and vaginal itching x2 days. Denies vaginal pain. Notes she used a latex condom earlier this week despite being allergic to latex. Denies dysuria. No med use for symptoms.

## 2024-09-10 NOTE — Discharge Instructions (Addendum)
-  For your yeast infection, start the Diflucan  (fluconazole )- Take one pill today (day 1). If you're still having symptoms in 3 days, take the second pill.  - Your test results will come back in about 3 days.  We call with abnormal results, we do not call with normal results.  You will also see your results on MyChart. -For symptomatic relief, you can also try something over-the-counter like Monistat, or taking a nice warm bath.

## 2024-09-10 NOTE — ED Provider Notes (Signed)
 [facility]    CSN: [id number] Arrival date & time: 09/10/24  0843      History   Chief Complaint Chief Complaint  Patient presents with   Vaginal Discharge    I think I have a yeast infection. Heavy white discharge, vaginal itching for 2 days. - Entered by patient    HPI Caitlin Hartman is a 33 y.o. female presenting with 2 days of vaginal symptoms following intercourse with latex condom.  The patient does have a latex allergy.  She describes white vaginal discharge, external itching, and discomfort after urinating.  Denies abdominal pain, back pain, fevers, vaginal rash or lesion.  Denies concern for STI.  She uses condoms for protection, LMP was 08/23/2024, periods are regular.  HPI  Past Medical History:  Diagnosis Date   Anemia    Asthma, moderate persistent    Bipolar disorder (HCC) 11/08/2017   Breast mass 11/08/2017   Right, 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     Chronic cholecystitis with calculus    Dyspnea    Fibromyalgia    Headache    Hypertension    Iron deficiency anemia    IUD (intrauterine device) in place 03/01/2018   Lyme disease 2013   Mental disorder    Rheumatoid aortitis 2014   pt states dx'd by ED doctor, has not followed up    Vaginal Pap smear, abnormal    Vitamin D  deficiency     Patient Active Problem List   Diagnosis Date Noted   Microcytosis 02/11/2023   Cholelithiasis 02/11/2023   Healthcare maintenance 02/11/2023   Multiple joint pain 10/17/2020   Carpal tunnel syndrome 05/09/2020   Bacterial vaginosis 01/11/2020   Need for history and physical examination for employment 09/02/2019   PTSD (post-traumatic stress disorder) 11/08/2017   Iron deficiency anemia 09/11/2017   ANA positive 04/16/2017   Low vitamin D  level 04/16/2017   Fibromyalgia 04/07/2017   Asthma, moderate persistent 01/31/2017   Thalassemia trait 08/03/2015   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 Authorizing Provider  albuterol  (VENTOLIN  HFA) 108 (90 Base) MCG/ACT inhaler Inhale 1-2 puffs into the lungs every 4 (four) hours as needed for wheezing or shortness of breath. 09/01/19 09/10/24 Yes Bland, Scott, DO  fluconazole  (DIFLUCAN ) 150 MG tablet Take 1 tablet (150 mg total) by mouth daily. -For your yeast infection, start the Diflucan  (fluconazole )- Take one pill today (day 1). If you're still having symptoms in 3 days, take the second pill. 09/10/24  Yes Arlyss Leita BRAVO, PA-C    Family History Family History  Problem Relation Age of Onset   Alcohol abuse Mother    Drug abuse Mother    Depression Mother    Mental illness Mother  Drug abuse Father    Cancer Maternal Aunt    Breast cancer Maternal Aunt    Diabetes Maternal Uncle    Alcohol abuse Maternal Grandmother    Cancer Maternal Grandmother    Diabetes Maternal Grandmother    Varicose Veins Maternal Grandmother    Drug abuse Maternal Grandfather    Cancer Paternal Grandmother    Breast cancer Paternal Grandmother     Social History Social History   Tobacco Use   Smoking status: Never    Passive exposure: Never   Smokeless tobacco: Never  Vaping Use   Vaping status: Never Used  Substance Use Topics   Alcohol use: Yes   Drug use: Yes    Frequency: 3.0 times per week    Types: Marijuana    Comment: pt reports using marijuana x3/ wk for her fibromyalgia     Allergies   Oxycodone -acetaminophen , Diphenhydramine -acetaminophen , and Latex   Review of Systems Review of Systems  Constitutional:  Negative for chills and  fever.  HENT:  Negative for sore throat.   Eyes:  Negative for pain and redness.  Respiratory:  Negative for shortness of breath.   Cardiovascular:  Negative for chest pain.  Gastrointestinal:  Negative for abdominal pain, diarrhea, nausea and vomiting.  Genitourinary:  Positive for vaginal discharge. Negative for decreased urine volume, difficulty urinating, dysuria, flank pain, frequency, genital sores, hematuria and urgency.  Musculoskeletal:  Negative for back pain.  Skin:  Negative for rash.     Physical Exam Triage Vital Signs ED Triage Vitals  Encounter Vitals Group     BP 09/10/24 0853 125/89     Girls Systolic BP Percentile --      Girls Diastolic BP Percentile --      Boys Systolic BP Percentile --      Boys Diastolic BP Percentile --      Pulse Rate 09/10/24 0853 70     Resp 09/10/24 0853 14     Temp 09/10/24 0853 98 F (36.7 C)     Temp Source 09/10/24 0853 Oral     SpO2 09/10/24 0853 100 %     Weight --      Height --      Head Circumference --      Peak Flow --      Pain Score 09/10/24 0854 0     Pain Loc --      Pain Education --      Exclude from Growth Chart --    No data found.  Updated Vital Signs BP 125/89 (BP Location: Left Arm)   Pulse 70   Temp 98 F (36.7 C) (Oral)   Resp 14   LMP 08/23/2024 (Exact Date)   SpO2 100%   Visual Acuity Right Eye Distance:   Left Eye Distance:   Bilateral Distance:    Right Eye Near:   Left Eye Near:    Bilateral Near:     Physical Exam Vitals reviewed.  Constitutional:      General: She is not in acute distress.    Appearance: Normal appearance. She is not ill-appearing.  HENT:     Head: Normocephalic and atraumatic.     Mouth/Throat:     Mouth: Mucous membranes are moist.     Comments: Moist mucous membranes Eyes:     Extraocular Movements: Extraocular movements intact.     Pupils: Pupils are equal, round, and reactive to light.  Cardiovascular:     Rate and Rhythm: Normal rate and regular  rhythm.     Heart sounds: Normal heart sounds.  Pulmonary:     Effort: Pulmonary effort is normal.     Breath sounds: Normal breath sounds. No wheezing, rhonchi or rales.  Abdominal:     General: Bowel sounds are normal. There is no distension.     Palpations: Abdomen is soft. There is no mass.     Tenderness: There is no abdominal tenderness. There is no right CVA tenderness, left CVA tenderness, guarding or rebound. Negative signs include Murphy's sign and McBurney's sign.  Skin:    General: Skin is warm.     Capillary Refill: Capillary refill takes less than 2 seconds.     Comments: Good skin turgor  Neurological:     General: No focal deficit present.     Mental Status: She is alert and oriented to person, place, and time.  Psychiatric:        Mood and Affect: Mood normal.        Behavior: Behavior normal.      UC Treatments / Results  Labs (all labs ordered are listed, but only abnormal results are displayed) Labs Reviewed  CERVICOVAGINAL ANCILLARY ONLY    EKG   Radiology No results found.  Procedures Procedures (including critical care time)  Medications Ordered in UC Medications - No data to display  Initial Impression / Assessment and Plan / UC Course  I have reviewed the triage vital signs and the nursing notes.  Pertinent labs & imaging results that were available during my care of the patient were reviewed by me and considered in my medical decision making (see chart for details).     Presenting with symptoms consistent with yeast vaginitis following sex with latex condom, patient has a latex allergy.  Will manage symptomatically with Diflucan  while awaiting cervicovaginal results.  We are testing for BV and yeast.  The patient denies concern for pregnancy, she always uses condoms and her periods are regular, she declines pregnancy test today.  Declines STI screen.  Return precautions discussed.  Final Clinical Impressions(s) / UC Diagnoses   Final  diagnoses:  Vaginitis and vulvovaginitis     Discharge Instructions      -For your yeast infection, start the Diflucan  (fluconazole )- Take one pill today (day 1). If you're still having symptoms in 3 days, take the second pill.  - Your test results will come back in about 3 days.  We call with abnormal results, we do not call with normal results.  You will also see your results on MyChart. -For symptomatic relief, you can also try something over-the-counter like Monistat, or taking a nice warm bath.   ED Prescriptions     Medication Sig Dispense Auth. Provider   fluconazole  (DIFLUCAN ) 150 MG tablet Take 1 tablet (150 mg total) by mouth daily. -For your yeast infection, start the Diflucan  (fluconazole )- Take one pill today (day 1). If you're still having symptoms in 3 days, take the second pill. 2 tablet Nathifa Ritthaler E, PA-C      PDMP not reviewed this encounter.   Arlyss Leita BRAVO, PA-C 09/10/24 [phone]

## 2024-09-13 ENCOUNTER — Ambulatory Visit (HOSPITAL_COMMUNITY): Payer: Self-pay

## 2024-09-13 LAB — CERVICOVAGINAL ANCILLARY ONLY
Bacterial Vaginitis (gardnerella): NEGATIVE
Candida Glabrata: NEGATIVE
Candida Vaginitis: POSITIVE — AB
Comment: NEGATIVE
Comment: NEGATIVE
Comment: NEGATIVE

## 2024-09-16 DIAGNOSIS — Z630 Problems in relationship with spouse or partner: Secondary | ICD-10-CM | POA: Diagnosis not present

## 2024-09-16 DIAGNOSIS — F3162 Bipolar disorder, current episode mixed, moderate: Secondary | ICD-10-CM | POA: Diagnosis not present

## 2024-09-29 DIAGNOSIS — Z630 Problems in relationship with spouse or partner: Secondary | ICD-10-CM | POA: Diagnosis not present

## 2024-09-29 DIAGNOSIS — F3161 Bipolar disorder, current episode mixed, mild: Secondary | ICD-10-CM | POA: Diagnosis not present

## 2024-10-13 DIAGNOSIS — Z630 Problems in relationship with spouse or partner: Secondary | ICD-10-CM | POA: Diagnosis not present

## 2024-10-13 DIAGNOSIS — F3161 Bipolar disorder, current episode mixed, mild: Secondary | ICD-10-CM | POA: Diagnosis not present

## 2024-10-27 DIAGNOSIS — Z635 Disruption of family by separation and divorce: Secondary | ICD-10-CM | POA: Diagnosis not present

## 2024-10-27 DIAGNOSIS — F310 Bipolar disorder, current episode hypomanic: Secondary | ICD-10-CM | POA: Diagnosis not present

## 2024-11-12 DIAGNOSIS — F3161 Bipolar disorder, current episode mixed, mild: Secondary | ICD-10-CM | POA: Diagnosis not present

## 2024-11-24 DIAGNOSIS — Z635 Disruption of family by separation and divorce: Secondary | ICD-10-CM | POA: Diagnosis not present

## 2024-11-24 DIAGNOSIS — F3131 Bipolar disorder, current episode depressed, mild: Secondary | ICD-10-CM | POA: Diagnosis not present
